# Patient Record
Sex: Male | Born: 1970 | Hispanic: No | Marital: Married | Smoking: Never smoker
Health system: Southern US, Community
[De-identification: ages and names within clinical notes are randomized; demographics above are authoritative.]

## PROBLEM LIST (undated history)

## (undated) DIAGNOSIS — E785 Hyperlipidemia, unspecified: Secondary | ICD-10-CM

## (undated) HISTORY — PX: BACK SURGERY: SHX140

## (undated) HISTORY — DX: Hyperlipidemia, unspecified: E78.5

---

## 2004-11-07 ENCOUNTER — Emergency Department (HOSPITAL_COMMUNITY): Admission: EM | Admit: 2004-11-07 | Discharge: 2004-11-07 | Payer: Self-pay | Admitting: Emergency Medicine

## 2007-02-16 IMAGING — CT CT MAXILLOFACIAL W/O CM
2 series · 16 of 40 positions shown, 20 images · non-contrast
Comparison: none

CLINICAL DATA: Injury to the left eye.  Struck with baseball.
TECHNIQUE: Axial scanning is performed using spiral technique.  Coronal reformations are done. 
 CT SCAN OF THE FACE:
 No evidence of facial fracture.  No fluid in the sinuses.  There is a soft tissue injury in the left periorbital tissues with some soft tissue swelling.  No definite injury to the globe is identified.

[Series 4627: — · axial · 0.35mm/px · z∈[-702,-572]mm · 13 of 151 slices shown, 17 images]
[im 11/151  brain]
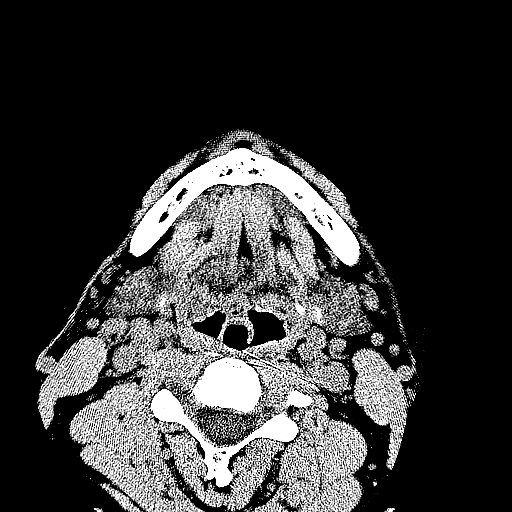
[im 11/151  bone]
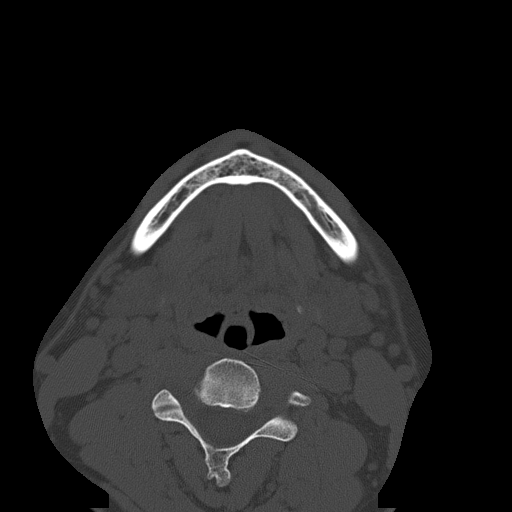
[im 21/151  bone]
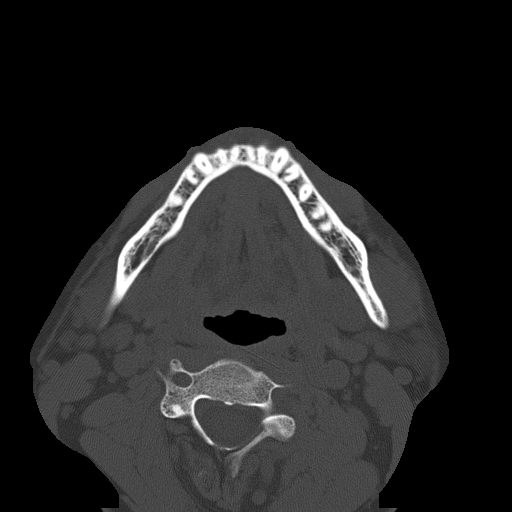
[im 32/151  bone]
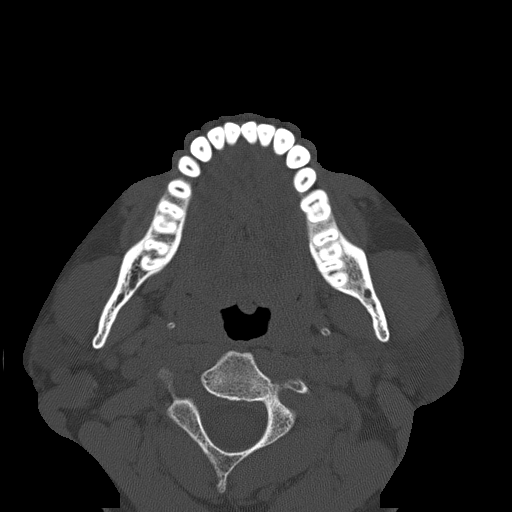
[im 42/151  bone]
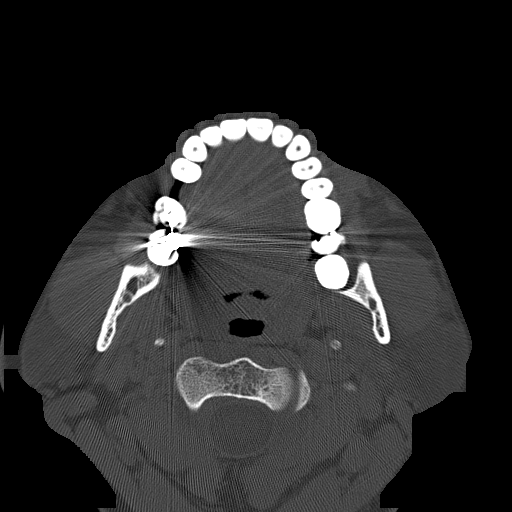
[im 52/151  brain]
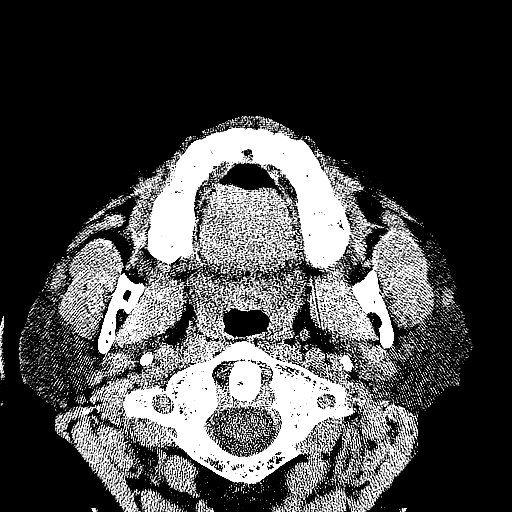
[im 52/151  bone]
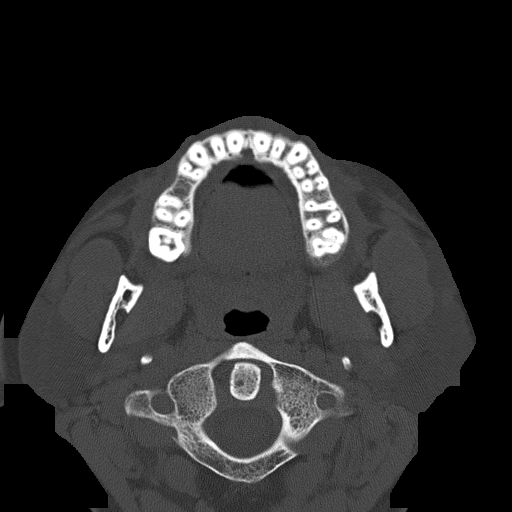
[im 63/151  bone]
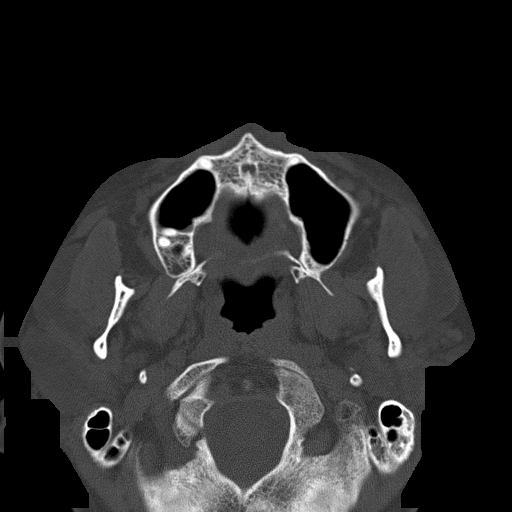
[im 78/151  bone]
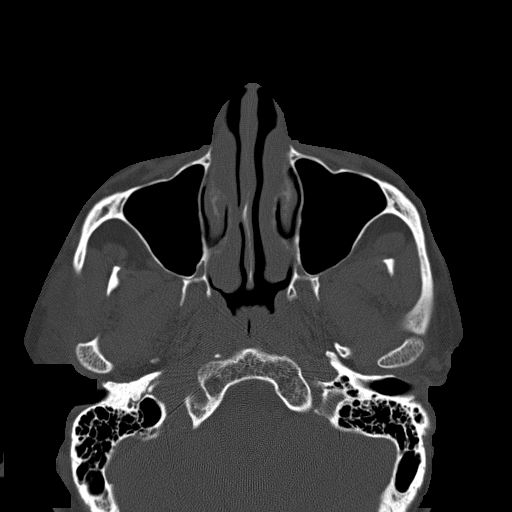
[im 88/151  bone]
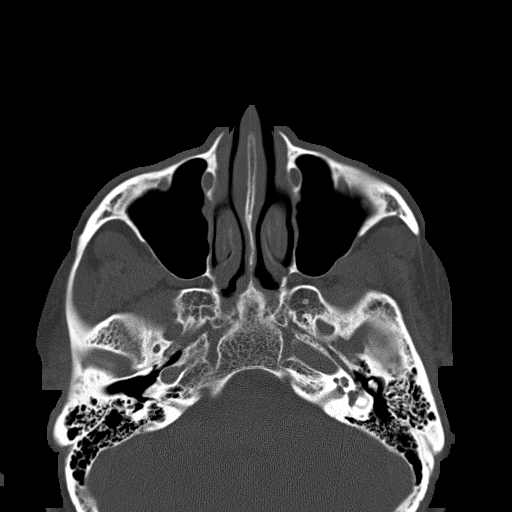
[im 99/151  brain]
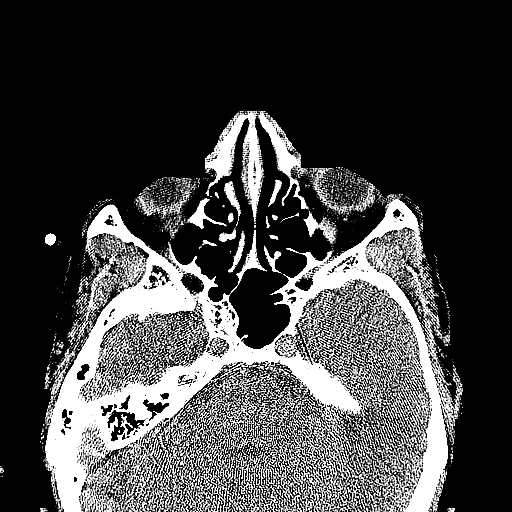
[im 99/151  bone]
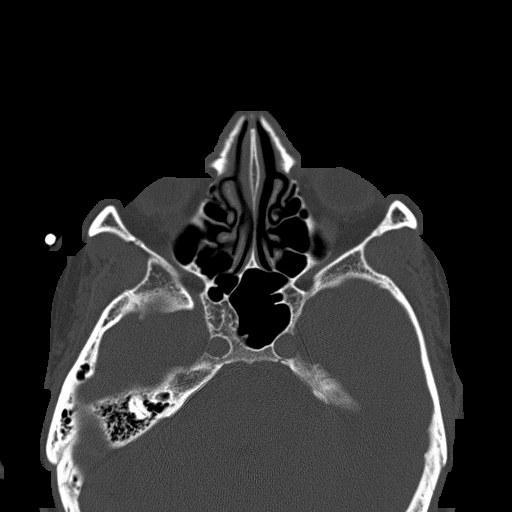
[im 109/151  bone]
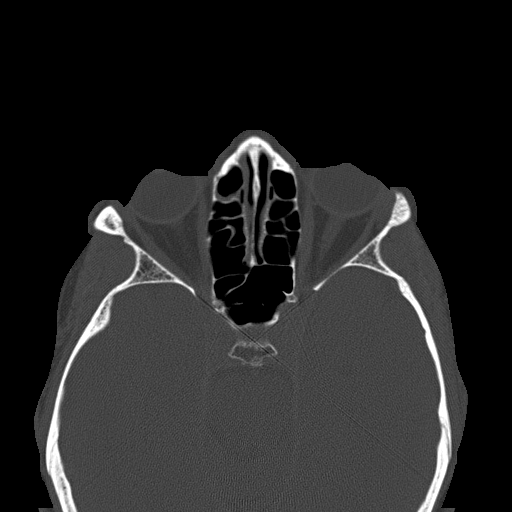
[im 119/151  bone]
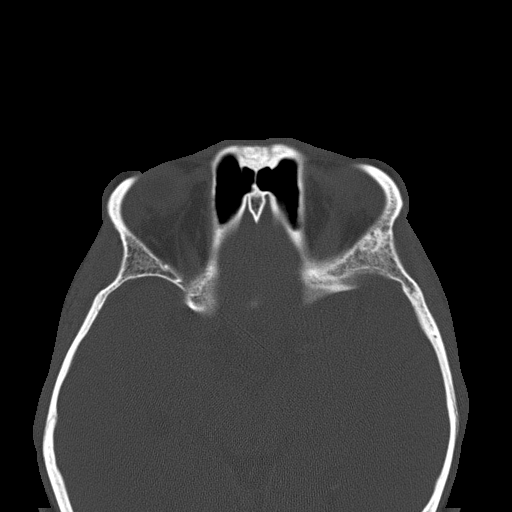
[im 130/151  bone]
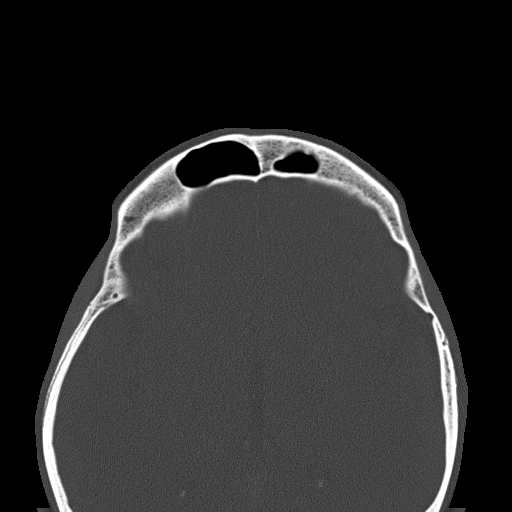
[im 140/151  brain]
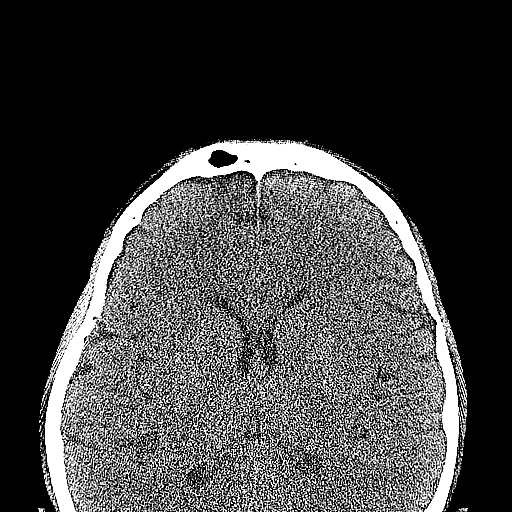
[im 140/151  bone]
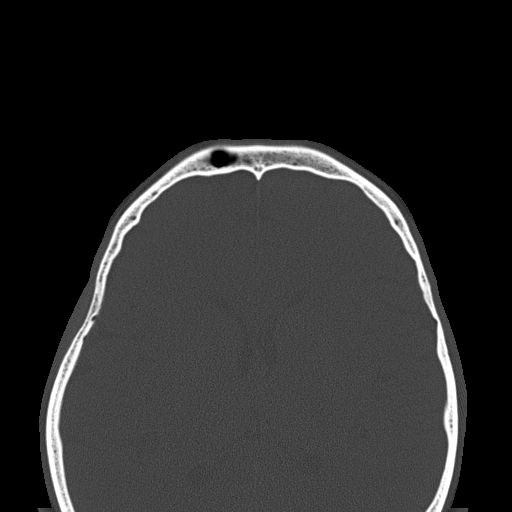

[coronals · coronal · 0.35mm/px · 3 of 50 slices shown]
[im 17/50  bone]
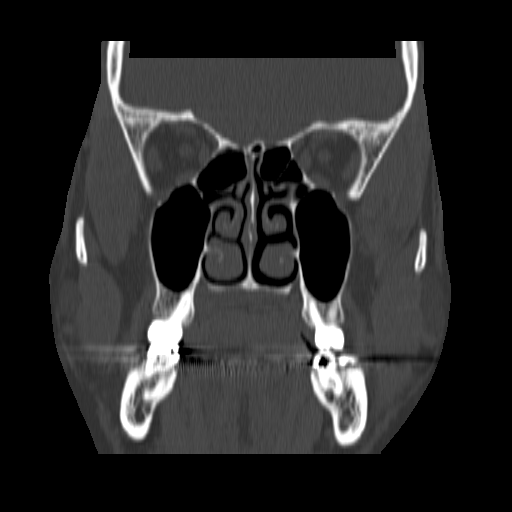
[im 22/50  bone]
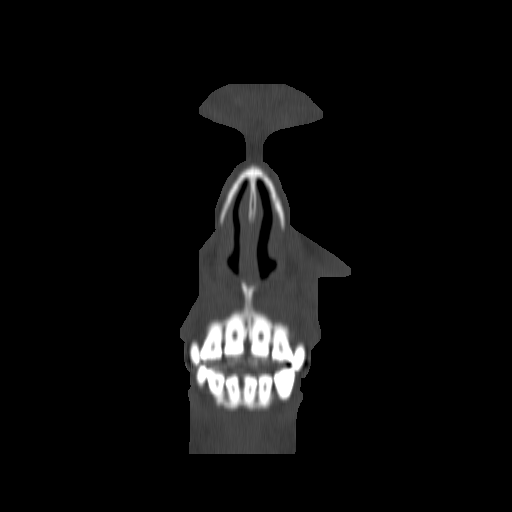
[im 28/50  bone]
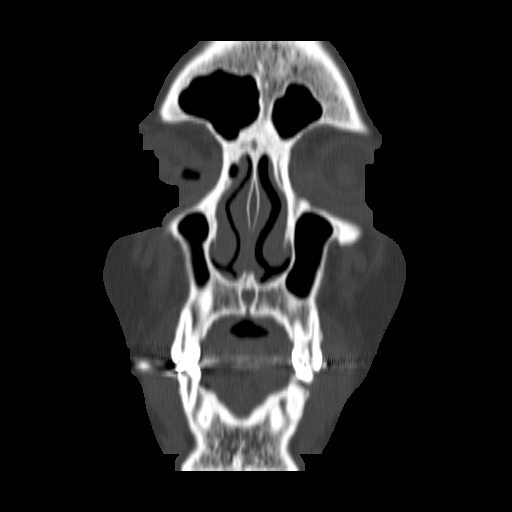

[16 of 40 positions shown; findings below may reference images not displayed]

IMPRESSION: Soft tissue injury in the region of the left orbit with superficial soft tissue swelling.   No evidence of fracture.

## 2008-04-23 ENCOUNTER — Ambulatory Visit (HOSPITAL_COMMUNITY): Admission: RE | Admit: 2008-04-23 | Discharge: 2008-04-24 | Payer: Self-pay | Admitting: Neurosurgery

## 2010-12-28 NOTE — Op Note (Signed)
NAMEANTHONYMICHAEL, MUNDAY NO.:  [id number]   MEDICAL RECORD NO.:  [id number]          PATIENT TYPE:  OIB   LOCATION:  3537                         FACILITY:  [facility]   PHYSICIAN:  Cristi Loron, M.D.DATE OF BIRTH:  [birth year]   DATE OF PROCEDURE:  04/23/2008  DATE OF DISCHARGE:                               OPERATIVE REPORT   BRIEF HISTORY:  The patient is a 40 year old Hispanic male who was  injured in a work-related accident.  He suffered back and leg pain.  He  was worked up with a lumbar MRI, which demonstrated a large herniated  disk at L4-5 with severe stenosis.  He failed medical management.  I  discussed the various treatment options with him including surgery.  The  patient weighed the risks, benefits, and alternatives of surgery, and  decided to proceed with a bilateral L4-5 diskectomy.   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   PROCEDURE:  Bilateral L4-5 diskectomy using microdissection.   SURGEON:  Cristi Loron, MD.   ASSISTANT:  Stefani Dama, MD.   ANESTHESIA:  General endotracheal.   ESTIMATED BLOOD LOSS:  75 mL.   SPECIMENS:  None.   DRAINS:  None.   COMPLICATIONS:  None.   DESCRIPTION OF PROCEDURE:  The patient was brought to the operative room  by the anesthesia team.  General endotracheal anesthesia was induced.  The patient was turned to the prone position on Wilson frame.  His  lumbosacral region was then prepared with Betadine, scrubbed with  Betadine solution.  Sterile drapes were applied.  I then injected the  area to be incised with Marcaine with epinephrine solution.  I used a  scalpel to make a linear midline incision over the L4-5 interspace.  I  used electrocautery to perform a bilateral subperiosteal dissection  exposing spinous process and lamina of the L4 and L5.  We obtained an  intraoperative radiograph to confirm our location and inserted  McCullough retractor for exposure.   We then brought the operative microscope into the field, and on  electromagnification and illumination, we completed the  microdissection/decompression.  I used a high-speed drill to perform  bilateral L4 laminotomies.  I widened the laminotomy with the Kerrison  punch and removed the L4-5 ligamentum flavum.  I performed  foraminotomies at the bilateral L5 nerve roots.  We then used  microdissection to free up the epidural tissue from the dura and the L5  nerve roots.  Dr. Danielle Dess gently retracted the thecal sac and nerve root  medially.  On the right side, this exposed underlying disk herniation.  We incised it with a 15-blade scalpel and removed the disk herniation  and performed a partial intervertebral diskectomy with the pituitary  forceps and Epstein curettes.  After we were satisfied with the  intervertebral diskectomy, we removed some redundant posterior  longitudinal ligaments from the vertebral endplates of the L4-5 further  decompressing the neural elements.  We then inspected into this and  L4-5  on the left side.  It was bulging mildly, but at this point, we had good  decompression of the thecal sac and the bilateral L5 nerve roots, and we  therefore did not enter into the intervertebral disk space on the left  side.  We then obtained hemostasis with bipolar electrocautery.  We  irrigated the wound with bacitracin solution.  We obtained stringent  hemostasis using bipolar electrocautery.  We removed the retractors and  then reapproximated the patient's thoracolumbar fascia with interrupted  #1 Vicryl suture, subcutaneous with interrupted 2-0 Vicryl suture, and  the skin with Steri-Strips and benzoin.  The wound was then closed with  bacitracin ointment.  A sterile dressing was applied.  The drapes were   removed.  The patient was subsequently returned to the supine position  where he was extubated by the anesthesia team and transported to the  postanesthesia care unit in stable condition.  All sponge, instrument,  and needle counts were correct at end of this case.      Cristi Loron, M.D.  Electronically Signed     JDJ/MEDQ  D:  04/23/2008  T:  04/24/2008  Job:  [id number]

## 2011-05-18 LAB — CBC
MCV: 94.4
RBC: 4.92
RDW: 13
WBC: 6.3

## 2013-12-03 ENCOUNTER — Encounter (HOSPITAL_COMMUNITY): Payer: Self-pay | Admitting: Emergency Medicine

## 2013-12-03 ENCOUNTER — Emergency Department (HOSPITAL_COMMUNITY): Payer: Managed Care, Other (non HMO)

## 2013-12-03 ENCOUNTER — Emergency Department (HOSPITAL_COMMUNITY)
Admission: EM | Admit: 2013-12-03 | Discharge: 2013-12-03 | Disposition: A | Discharge status: Home / Self Care | Payer: Managed Care, Other (non HMO) | Attending: Emergency Medicine | Admitting: Emergency Medicine

## 2013-12-03 DIAGNOSIS — R509 Fever, unspecified: Secondary | ICD-10-CM

## 2013-12-03 DIAGNOSIS — J069 Acute upper respiratory infection, unspecified: Secondary | ICD-10-CM | POA: Insufficient documentation

## 2013-12-03 DIAGNOSIS — B9789 Other viral agents as the cause of diseases classified elsewhere: Secondary | ICD-10-CM

## 2013-12-03 DIAGNOSIS — Z791 Long term (current) use of non-steroidal anti-inflammatories (NSAID): Secondary | ICD-10-CM | POA: Insufficient documentation

## 2013-12-03 DIAGNOSIS — J988 Other specified respiratory disorders: Secondary | ICD-10-CM

## 2013-12-03 LAB — COMPREHENSIVE METABOLIC PANEL
ALT: 46 U/L (ref 0–53)
AST: 35 U/L (ref 0–37)
Albumin: 3.7 g/dL (ref 3.5–5.2)
Alkaline Phosphatase: 98 U/L (ref 39–117)
BUN: 15 mg/dL (ref 6–23)
CO2: 27 mEq/L (ref 19–32)
Calcium: 8.8 mg/dL (ref 8.4–10.5)
Chloride: 101 mEq/L (ref 96–112)
Creatinine, Ser: 0.83 mg/dL (ref 0.50–1.35)
GFR calc Af Amer: >90 mL/min (ref >90)
GFR calc non Af Amer: >90 mL/min (ref >90)
GLUCOSE: 102 mg/dL — AB (ref 70–99)
Potassium: 3.9 mEq/L (ref 3.7–5.3)
Sodium: 136 mEq/L — ABNORMAL LOW (ref 137–147)
TOTAL PROTEIN: 7 g/dL (ref 6.0–8.3)
Total Bilirubin: 0.8 mg/dL (ref 0.3–1.2)

## 2013-12-03 LAB — I-STAT CG4 LACTIC ACID, ED: Lactic Acid, Venous: 1.19 mmol/L (ref 0.5–2.2)

## 2013-12-03 LAB — CBC WITH DIFFERENTIAL/PLATELET
BASOS ABS: 0 10*3/uL (ref 0.0–0.1)
BASOS PCT: 0 % (ref 0–1)
EOS ABS: 0.1 10*3/uL (ref 0.0–0.7)
EOS PCT: 1 % (ref 0–5)
HCT: 46 % (ref 39.0–52.0)
Hemoglobin: 15.7 g/dL (ref 13.0–17.0)
LYMPHS ABS: 0.5 10*3/uL — AB (ref 0.7–4.0)
LYMPHS PCT: 9 % — AB (ref 12–46)
MCH: 30.7 pg (ref 26.0–34.0)
MCHC: 34.1 g/dL (ref 30.0–36.0)
MCV: 89.8 fL (ref 78.0–100.0)
MONO ABS: 0.4 10*3/uL (ref 0.1–1.0)
Monocytes Relative: 8 % (ref 3–12)
Neutro Abs: 4.7 10*3/uL (ref 1.7–7.7)
Neutrophils Relative %: 82 % — ABNORMAL HIGH (ref 43–77)
PLATELETS: 149 10*3/uL — AB (ref 150–400)
RBC: 5.12 MIL/uL (ref 4.22–5.81)
RDW: 13.4 % (ref 11.5–15.5)
WBC: 5.7 10*3/uL (ref 4.0–10.5)

## 2013-12-03 LAB — URINALYSIS, ROUTINE W REFLEX MICROSCOPIC
Bilirubin Urine: NEGATIVE
Glucose, UA: NEGATIVE mg/dL
Hgb urine dipstick: NEGATIVE
KETONES UR: NEGATIVE mg/dL
Leukocytes, UA: NEGATIVE
Nitrite: NEGATIVE
Protein, ur: NEGATIVE mg/dL
SPECIFIC GRAVITY, URINE: 1.022 (ref 1.005–1.030)
UROBILINOGEN UA: 1 mg/dL (ref 0.0–1.0)
pH: 6.5 (ref 5.0–8.0)

## 2013-12-03 MED ORDER — IBUPROFEN 800 MG PO TABS
800.0000 mg | ORAL_TABLET | Freq: Once | ORAL | Status: AC
  Administered 2013-12-03: 800 mg via ORAL
  Filled 2013-12-03: qty 1

## 2013-12-03 MED ORDER — ACETAMINOPHEN 325 MG PO TABS
650.0000 mg | ORAL_TABLET | Freq: Once | ORAL | Status: AC
  Administered 2013-12-03: 650 mg via ORAL
  Filled 2013-12-03: qty 2

## 2013-12-03 MED ORDER — SODIUM CHLORIDE 0.9 % IV BOLUS (SEPSIS)
1000.0000 mL | Freq: Once | INTRAVENOUS | Status: AC
  Administered 2013-12-03: 1000 mL via INTRAVENOUS

## 2013-12-03 MED ORDER — HYDROCODONE-HOMATROPINE 5-1.5 MG/5ML PO SYRP: 5.0000 mL | ORAL_SOLUTION | Freq: Four times a day (QID) | ORAL | Status: DC | PRN

## 2013-12-03 MED ORDER — ALBUTEROL SULFATE HFA 108 (90 BASE) MCG/ACT IN AERS
2.0000 | INHALATION_SPRAY | Freq: Once | RESPIRATORY_TRACT | Status: AC
  Administered 2013-12-03: 2 via RESPIRATORY_TRACT
  Filled 2013-12-03: qty 6.7

## 2013-12-03 NOTE — ED Notes (Signed)
Per pt sts he is hurting all over, weakness, body aches, nausea. sts he has a cough that started today.

## 2013-12-03 NOTE — ED Notes (Signed)
Phlebotomy at bedside.

## 2013-12-03 NOTE — ED Notes (Signed)
Ambulated patient in hall. Patient maintained O2 saturation levels no less than 94% with the heart rate at a peak of 104 and low of 96 BPM.

## 2013-12-03 NOTE — ED Notes (Signed)
PA at bedside.

## 2013-12-03 NOTE — ED Notes (Signed)
Patient transported to X-ray 

## 2013-12-03 NOTE — Discharge Instructions (Signed)
Recommend that you alternate Tylenol and ibuprofen every 3 hours for fever control. Be sure to drink plenty of fluids. You may use albuterol inhaler, 2 puffs every 4 hours as needed for shortness of breath. You may use Vicodin as prescribed for cough. Access MyChart to followup on the results of your [facility] test [facility]. Followup with your primary care provider to ensure that symptoms resolve.  Viral Infections A viral infection can be caused by different types of viruses.Most viral infections are not serious and resolve on their own. However, some infections may cause severe symptoms and may lead to further complications. SYMPTOMS Viruses can frequently cause:  Minor sore throat.  Aches and pains.  Headaches.  Runny nose.  Different types of rashes.  Watery eyes.  Tiredness.  Cough.  Loss of appetite.  Gastrointestinal infections, resulting in nausea, vomiting, and diarrhea. These symptoms do not respond to antibiotics because the infection is not caused by bacteria. However, you might catch a bacterial infection following the viral infection. This is sometimes called a "superinfection." Symptoms of such a bacterial infection may include:  Worsening sore throat with pus and difficulty swallowing.  Swollen neck glands.  Chills and a high or persistent fever.  Severe headache.  Tenderness over the sinuses.  Persistent overall ill feeling (malaise), muscle aches, and tiredness (fatigue).  Persistent cough.  Yellow, green, or brown mucus production with coughing. HOME CARE INSTRUCTIONS   Only take over-the-counter or prescription medicines for pain, discomfort, diarrhea, or fever as directed by your caregiver.  Drink enough water and fluids to keep your urine clear or pale yellow. Sports drinks can provide valuable electrolytes, sugars, and hydration.  Get plenty of rest and maintain proper nutrition. Soups and broths with crackers or rice are  fine. SEEK IMMEDIATE MEDICAL CARE IF:   You have severe headaches, shortness of breath, chest pain, neck pain, or an unusual rash.  You have uncontrolled vomiting, diarrhea, or you are unable to keep down fluids.  You or your child has an oral temperature above 102 F (38.9 C), not controlled by medicine.  Your baby is older than 3 months with a rectal temperature of 102 F (38.9 C) or higher.  Your baby is 223 months old or younger with a rectal temperature of 100.4 F (38 C) or higher. MAKE SURE YOU:   Understand these instructions.  Will watch your condition.  Will get help right away if you are not doing well or get worse. Document Released: 05/11/2005 Document Revised: 10/24/2011 Document Reviewed: 12/06/2010 [facility] Patient Information 2014 [employer] [place].

## 2013-12-03 NOTE — ED Provider Notes (Signed)
CSN: [id number]     Arrival date & time 12/03/13  1831 History   First MD Initiated Contact with Patient 12/03/13 2012     Chief Complaint  Patient presents with  . Generalized Body Aches  . Nausea  . Cough    (Consider location/radiation/quality/duration/timing/severity/associated sxs/prior Treatment) HPI Comments: Patient is a 43 year old male with no significant past medical history who presents to the emergency department for generalized fatigue and malaise, myalgias, nausea, cough, and shortness of breath. Patient states that symptoms began today and has progressively worsened since onset. Patient has not tried anything for symptoms. Symptoms have been associated with a fever and patient febrile in the emergency department to 102F. Patient denies associated syncope, chest pain, vomiting, diarrhea, melena or hematochezia, dysuria or hematuria, rashes, numbness/tingling, and weakness.  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 She states that patient has had Lyme disease in the past and never showed signs of an outward rash. He denies any recent travel or contact with persons.  The history is provided by the patient. No language interpreter was used.    History reviewed. No pertinent past medical history. Past Surgical History  Procedure Laterality Date  . Back surgery     History reviewed. No pertinent family history. History  Substance Use Topics  . Smoking status: Never Smoker   . Smokeless tobacco: Not on file  . Alcohol Use: No    Review of Systems  Constitutional: Positive for fever, activity change and fatigue.  Respiratory: Positive for cough and shortness of breath.   Gastrointestinal: Positive for nausea. Negative for vomiting and diarrhea.  Genitourinary: Negative for dysuria and hematuria.  Skin: Negative for rash.  Neurological: Negative for syncope, weakness and numbness.  All other systems reviewed and are  negative.     Allergies  Review of patient's allergies indicates no known allergies.  Home Medications   Prior to Admission medications   Medication Sig Start Date End Date Taking? Authorizing Provider  diclofenac (VOLTAREN) 50 MG EC tablet Take 50 mg by mouth daily as needed for mild pain.   Yes Historical Provider, MD  ibuprofen (ADVIL,MOTRIN) 800 MG tablet Take 800 mg by mouth every 8 (eight) hours as needed for moderate pain.   Yes Historical Provider, MD   BP 97/58  Pulse 88  Temp(Src) 101 F (38.3 C) (Oral)  Resp 20  Wt 183 lb (83.008 kg)  SpO2 93%  Physical Exam  Nursing note and vitals reviewed. Constitutional: He is oriented to person, place, and time. He appears well-developed and well-nourished. No distress.  Patient appears ill and fatigued, but nontoxic/nonseptic appearing.  HENT:  Head: Normocephalic and atraumatic.  Mouth/Throat: Oropharynx is clear and moist. No oropharyngeal exudate.  Eyes: Conjunctivae and EOM are normal. Pupils are equal, round, and reactive to light. No scleral icterus.  Neck: Normal range of motion.  Cardiovascular: Normal rate, regular rhythm and normal heart sounds.   Pulmonary/Chest: Effort normal and breath sounds normal. No respiratory distress. He has no wheezes. He has no rales.  Chest expansion symmetrical. No tachypnea, retractions, or accessory muscle use.  Abdominal: Soft. He exhibits no distension. There is no tenderness. There is no rebound and no guarding.  No focal tenderness or peritoneal signs.  Musculoskeletal: Normal range of motion.  Neurological: He is alert and oriented to person, place, and time.  GCS 15. Speech is goal oriented. Patient moves extremities without ataxia.  Skin: Skin is warm  and dry. No rash noted. He is not diaphoretic. No erythema. No pallor.  Psychiatric: He has a normal mood and affect. His behavior is normal.    ED Course  Procedures (including critical care time) Labs Review Labs Reviewed   CBC WITH DIFFERENTIAL - Abnormal; Notable for the following:    Platelets 149 (*)    Neutrophils Relative % 82 (*)    Lymphocytes Relative 9 (*)    Lymphs Abs 0.5 (*)    All other components within normal limits  COMPREHENSIVE METABOLIC PANEL - Abnormal; Notable for the following:    Sodium 136 (*)    Glucose, Bld 102 (*)    All other components within normal limits  URINALYSIS, ROUTINE W REFLEX MICROSCOPIC  B. BURGDORFI ANTIBODIES  ROCKY MTN SPOTTED FVR AB, IGM-BLOOD  I-STAT CG4 LACTIC ACID, ED    Imaging Review Dg Chest 2 View  12/03/2013   CLINICAL DATA:  Shortness of breath, dizziness, and weakness. Some Cough. Never smoker.  EXAM: CHEST  2 VIEW  COMPARISON:  None.  FINDINGS: The heart size and mediastinal contours are within normal limits. Both lungs are clear. The visualized skeletal structures are unremarkable.  IMPRESSION: No active cardiopulmonary disease.   Electronically Signed   By: Burman NievesWilliam  Stevens M.D.   On: 12/03/2013 21:43     EKG Interpretation None      MDM   Final diagnoses:  Febrile illness  Viral respiratory illness    [id number] - 43 year old male presents for fever, malaise, weakness, myalgias, and cough. 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 Patient is ill appearing, but nontoxic/nonseptic appearing. Lungs clear to auscultation bilaterally and heart RRR. Labs today reveal no leukocytosis, anemia, or electrolyte imbalance. Liver and kidney function preserved. Urinalysis does not suggest infection. Lactate normal and chest x-ray without evidence of focal consolidation or pneumonia.  [facility] run given mildly low platelets and sodium in presence of fever. Have also run Lyme testing. Both tests are currently pending. Patient has responded well to antipyretics and IVF. He ambulates in the ED without hypoxia despite feeling mildly SOB. Will d/c with albuterol inhaler and Hycodan for symptomatic treatment. Will  f/u on results of pending labs. Patient agreeable to plan with no unaddressed concerns.  12/05/13 0515 Flow manager contacted as RMSF test came back high c/w acute infection. Have requested patient be prescribed doxycycline 100mg  BID x 10 days. Flow manager informed me that critical results and abx already called in for patient. Notes reviewed which confirmed that case reviewed by colleague Marlon Peliffany Greene, PA-C on 12/04/13.    Antony MaduraKelly Tristyn Demarest, PA-C 12/05/13 [phone]

## 2013-12-04 ENCOUNTER — Telehealth (HOSPITAL_BASED_OUTPATIENT_CLINIC_OR_DEPARTMENT_OTHER): Payer: Self-pay

## 2013-12-04 LAB — B. BURGDORFI ANTIBODIES: B BURGDORFERI AB IGG+ IGM: 0.79 {ISR}

## 2013-12-04 LAB — ROCKY MTN SPOTTED FVR AB, IGM-BLOOD: RMSF IGM: 2.95 IV — AB (ref 0.00–0.89)

## 2013-12-04 MED ORDER — DOXYCYCLINE HYCLATE 100 MG PO CAPS: 100.0000 mg | ORAL_CAPSULE | Freq: Two times a day (BID) | ORAL | Status: DC

## 2013-12-04 NOTE — ED Provider Notes (Signed)
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  He has tested positive [facility].   Doxycycline, 100 mg tab PO BID for 10 days.  Results for orders placed during the hospital encounter of 12/03/13 (from the past 24 hour(s))  CBC WITH DIFFERENTIAL     Status: Abnormal   Collection Time    12/03/13  7:00 PM      Result Value Ref Range   WBC 5.7  4.0 - 10.5 K/uL   RBC 5.12  4.22 - 5.81 MIL/uL   Hemoglobin 15.7  13.0 - 17.0 g/dL   HCT 16.146.0  09.639.0 - 04.552.0 %   MCV 89.8  78.0 - 100.0 fL   MCH 30.7  26.0 - 34.0 pg   MCHC 34.1  30.0 - 36.0 g/dL   RDW 40.913.4  81.111.5 - 91.415.5 %   Platelets 149 (*) 150 - 400 K/uL   Neutrophils Relative % 82 (*) 43 - 77 %   Neutro Abs 4.7  1.7 - 7.7 K/uL   Lymphocytes Relative 9 (*) 12 - 46 %   Lymphs Abs 0.5 (*) 0.7 - 4.0 K/uL   Monocytes Relative 8  3 - 12 %   Monocytes Absolute 0.4  0.1 - 1.0 K/uL   Eosinophils Relative 1  0 - 5 %   Eosinophils Absolute 0.1  0.0 - 0.7 K/uL   Basophils Relative 0  0 - 1 %   Basophils Absolute 0.0  0.0 - 0.1 K/uL  COMPREHENSIVE METABOLIC PANEL     Status: Abnormal   Collection Time    12/03/13  7:00 PM      Result Value Ref Range   Sodium 136 (*) 137 - 147 mEq/L   Potassium 3.9  3.7 - 5.3 mEq/L   Chloride 101  96 - 112 mEq/L   CO2 27  19 - 32 mEq/L   Glucose, Bld 102 (*) 70 - 99 mg/dL   BUN 15  6 - 23 mg/dL   Creatinine, Ser 7.820.83  0.50 - 1.35 mg/dL   Calcium 8.8  8.4 - 95.610.5 mg/dL   Total Protein 7.0  6.0 - 8.3 g/dL   Albumin 3.7  3.5 - 5.2 g/dL   AST 35  0 - 37 U/L   ALT 46  0 - 53 U/L   Alkaline Phosphatase 98  39 - 117 U/L   Total Bilirubin 0.8  0.3 - 1.2 mg/dL   GFR calc non Af Amer >90  >90 mL/min   GFR calc Af Amer >90  >90 mL/min  URINALYSIS, ROUTINE W REFLEX MICROSCOPIC     Status: None   Collection Time    12/03/13  7:24 PM      Result Value Ref Range   Color, Urine YELLOW  YELLOW   APPearance CLEAR  CLEAR   Specific Gravity, Urine 1.022  1.005 - 1.030   pH 6.5  5.0 - 8.0   Glucose, UA NEGATIVE  NEGATIVE mg/dL   Hgb urine dipstick NEGATIVE  NEGATIVE   Bilirubin Urine NEGATIVE  NEGATIVE   Ketones, ur NEGATIVE  NEGATIVE mg/dL   Protein, ur NEGATIVE  NEGATIVE mg/dL   Urobilinogen, UA 1.0  0.0 - 1.0 mg/dL   Nitrite NEGATIVE  NEGATIVE   Leukocytes, UA NEGATIVE  NEGATIVE  B. BURGDORFI ANTIBODIES     Status: None   Collection Time    12/03/13 10:37 PM  Result Value Ref Range   B burgdorferi Ab IgG+IgM 0.79    ROCKY MTN SPOTTED FVR AB, IGM-BLOOD     Status: Abnormal   Collection Time    12/03/13 10:37 PM      Result Value Ref Range   RMSF IgM 2.95 (*) 0.00 - 0.89 IV  I-STAT CG4 LACTIC ACID, ED     Status: None   Collection Time    12/03/13 10:46 PM      Result Value Ref Range   Lactic Acid, Venous 1.19  0.5 - 2.2 mmol/L     Dorthula Matasiffany G Ellakate Gonsalves, PA-C 12/04/13 1840  Shaquanda Graves Irine SealG Kadi Hession, PA-C 12/04/13 1840  Dorthula Matasiffany G Shanessa Hodak, PA-C 12/04/13 1840

## 2013-12-04 NOTE — ED Provider Notes (Signed)
Medical screening examination/treatment/procedure(s) were performed by non-physician practitioner and as supervising physician I was immediately available for consultation/collaboration.   EKG Interpretation None        Charles B. Sheldon, MD 12/04/13 2242 

## 2013-12-04 NOTE — Telephone Encounter (Signed)
[facility] w[facility]critical RMSF IgM of 2.95.  Chart reviewed by T. Neva SeatGreene PA "Doxycyline 100 mg capsule, Take 1 capsule (100 mg total) by mouth 2 x daily # 20, No RF"  Called and spoke w/ pts wife.  Pt still very sick.  Informed of Dx and need for addl medication.  [facility] [phone].

## 2013-12-06 NOTE — ED Provider Notes (Signed)
Medical screening examination/treatment/procedure(s) were performed by non-physician practitioner and as supervising physician I was immediately available for consultation/collaboration.    Celene KrasJon R Shirelle Tootle, MD 12/06/13 [phone]

## 2014-02-11 ENCOUNTER — Ambulatory Visit (INDEPENDENT_AMBULATORY_CARE_PROVIDER_SITE_OTHER): Payer: Managed Care, Other (non HMO) | Admitting: Internal Medicine

## 2014-02-11 ENCOUNTER — Encounter: Payer: Self-pay | Admitting: Internal Medicine

## 2014-02-11 VITALS — BP 128/82 | HR 43 | Temp 97.8°F | Ht 66.0 in | Wt 191.0 lb

## 2014-02-11 DIAGNOSIS — R609 Edema, unspecified: Secondary | ICD-10-CM

## 2014-02-11 DIAGNOSIS — R600 Localized edema: Secondary | ICD-10-CM

## 2014-02-11 DIAGNOSIS — R52 Pain, unspecified: Secondary | ICD-10-CM

## 2014-02-11 DIAGNOSIS — E785 Hyperlipidemia, unspecified: Secondary | ICD-10-CM | POA: Insufficient documentation

## 2014-02-11 LAB — CBC WITH DIFFERENTIAL/PLATELET
BASOS ABS: 0 10*3/uL (ref 0.0–0.1)
Basophils Relative: 0 % (ref 0–1)
EOS PCT: 3 % (ref 0–5)
Eosinophils Absolute: 0.2 10*3/uL (ref 0.0–0.7)
HEMATOCRIT: 46.9 % (ref 39.0–52.0)
Hemoglobin: 16 g/dL (ref 13.0–17.0)
LYMPHS ABS: 2.1 10*3/uL (ref 0.7–4.0)
Lymphocytes Relative: 37 % (ref 12–46)
MCH: 30.1 pg (ref 26.0–34.0)
MCHC: 34.1 g/dL (ref 30.0–36.0)
MCV: 88.2 fL (ref 78.0–100.0)
MONO ABS: 0.6 10*3/uL (ref 0.1–1.0)
MONOS PCT: 10 % (ref 3–12)
NEUTROS ABS: 2.9 10*3/uL (ref 1.7–7.7)
NEUTROS PCT: 50 % (ref 43–77)
Platelets: 175 10*3/uL (ref 150–400)
RBC: 5.32 MIL/uL (ref 4.22–5.81)
RDW: 13.9 % (ref 11.5–15.5)
WBC: 5.7 10*3/uL (ref 4.0–10.5)

## 2014-02-11 LAB — COMPREHENSIVE METABOLIC PANEL
ALBUMIN: 4.2 g/dL (ref 3.5–5.2)
ALT: 34 U/L (ref 0–53)
AST: 26 U/L (ref 0–37)
Alkaline Phosphatase: 92 U/L (ref 39–117)
BUN: 13 mg/dL (ref 6–23)
CO2: 30 mEq/L (ref 19–32)
CREATININE: 0.9 mg/dL (ref 0.50–1.35)
Calcium: 9.1 mg/dL (ref 8.4–10.5)
Chloride: 102 mEq/L (ref 96–112)
Glucose, Bld: 96 mg/dL (ref 70–99)
Potassium: 4.1 mEq/L (ref 3.5–5.3)
SODIUM: 137 meq/L (ref 135–145)
Total Bilirubin: 0.6 mg/dL (ref 0.2–1.2)
Total Protein: 6.7 g/dL (ref 6.0–8.3)

## 2014-02-11 LAB — C-REACTIVE PROTEIN: CRP: <0.5 mg/dL (ref <0.60)

## 2014-02-11 LAB — RHEUMATOID FACTOR

## 2014-02-11 LAB — SEDIMENTATION RATE: SED RATE: 1 mm/h (ref 0–16)

## 2014-02-11 NOTE — Progress Notes (Addendum)
Patient ID: Phillip Dickerson, male   DOB: March 24, 1971, 43 y.o.   MRN: [id number]         [facility]  Patient Active Problem List   Diagnosis Date Noted  . Dyslipidemia 02/11/2014  . Diffuse pain 02/11/2014  . Pedal edema 02/11/2014    Patient's Medications  New Prescriptions   No medications on file  Previous Medications   IBUPROFEN (ADVIL,MOTRIN) 800 MG TABLET    Take 800 mg by mouth every 8 (eight) hours as needed for moderate pain.  Modified Medications   No medications on file  Discontinued Medications   DICLOFENAC (VOLTAREN) 50 MG EC TABLET    Take 50 mg by mouth daily as needed for mild pain.   DOXYCYCLINE (VIBRAMYCIN) 100 MG CAPSULE    Take 1 capsule (100 mg total) by mouth 2 (two) times daily.   HYDROCODONE-HOMATROPINE (HYCODAN) 5-1.5 MG/5ML SYRUP    Take 5 mLs by mouth every 6 (six) hours as needed for cough.    Subjective: Mr. Lucius ConnMedina is referred to me for evaluation of chronic, diffuse pain and possible Lyme disease. He started having diffuse pain in his hands arms back and legs for about one year ago. He's also had intermittent pedal edema about once a month. 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 He has frequent tick exposure. He had Lyme serology done in July of 2014. I have the results of the enzyme immunoassay screening tests and the total IgM and IgG antibody and separate IgM antibody were both positive. I do not see the Western blot confirmation was done at that time. He does not recall being treated at that time.  He developed an acute febrile illness in April and was seen here in our emergency department. Document spotted fever serology was obtained and was positive. Repeat Lyme serology was obtained. The IgM EIA screening test was again positive but the IgG Western blot and IgM Western blot confirmatory test were negative.  The day after being in the emergency department  he was started on doxycycline and took that for 2 months. His fevers resolved within a few days. His wife recalls noting a red rash on his back about 3 days after starting doxycycline. The rash resolved fairly promptly. He noted no improvement in his chronic pain or the intermittent pedal edema. He had a great deal of gastric upset while on doxycycline.  Review of Systems: Constitutional: positive for transient fever in April, negative for anorexia, chills, fevers, sweats and weight loss Eyes: negative Ears, nose, mouth, throat, and face: negative Respiratory: negative Cardiovascular: negative Gastrointestinal: negative Genitourinary:negative  Past Medical History  Diagnosis Date  . Hyperlipidemia     History  Substance Use Topics  . Smoking status: Never Smoker   . Smokeless tobacco: Never Used  . Alcohol Use: No    Family History  Problem Relation Age of Onset  . Diabetes Mother   . Heart disease Father   . Diabetes Father   . Hypertension Father   . Hyperlipidemia Father     No Known Allergies  Objective: Temp: 97.8 F (36.6 C) (06/30 1019) Temp src: Oral (06/30 1019) BP: 128/82 mmHg (06/30 1019) Pulse Rate: 43 (06/30 1019)  General: He is in no distress Skin: No rash Lymph nodes: No palpable adenopathy Oral: No oropharyngeal lesions Lungs: Clear Cor: Regular S1 and S2 with no murmurs Abdomen: Soft and nontender with  no palpable masses Joints and extremities: No abnormalities noted  Lab Results Sedimentation rate 02/03/2014: 9   Assessment: I cannot rule out the possibility that [facility] that was treated with doxycycline. I do not believe he has had Lyme disease. The screening EIA for Lyme disease he does frequently falsely positive. A negative western blot tests rule out Lyme disease. I do not think that he is likely to have an infectious cause for his chronic pain or intermittent pedal edema.  Plan: 1. Check CBC,  complete metabolic panel and rheumatoid factor   Cliffton AstersJohn Shineka Auble, MD [facility] [phone] pager   [phone] cell 02/11/2014, 12:20 PM  Addendum:  Lab Results  Component Value Date   WBC 5.7 02/11/2014   HGB 16.0 02/11/2014   HCT 46.9 02/11/2014   MCV 88.2 02/11/2014   PLT 175 02/11/2014   CMP     Component Value Date/Time   NA 137 02/11/2014 1108   K 4.1 02/11/2014 1108   CL 102 02/11/2014 1108   CO2 30 02/11/2014 1108   GLUCOSE 96 02/11/2014 1108   BUN 13 02/11/2014 1108   CREATININE 0.90 02/11/2014 1108   CREATININE 0.83 12/03/2013 1900   CALCIUM 9.1 02/11/2014 1108   PROT 6.7 02/11/2014 1108   ALBUMIN 4.2 02/11/2014 1108   AST 26 02/11/2014 1108   ALT 34 02/11/2014 1108   ALKPHOS 92 02/11/2014 1108   BILITOT 0.6 02/11/2014 1108   GFRNONAA >90 12/03/2013 1900   GFRAA >90 12/03/2013 1900   Sed Rate (mm/hr)  Date Value  02/11/2014 1      CRP (mg/dL)  Date Value  1/91/47826/30/2015 <0.5    Rheumatoid factor 02/11/2014: Negative  I do not believe that he has Lyme disease or any other active infection at this time. I suspect that his chronic pain is noninfectious.  Cliffton AstersJohn Khadijah Mastrianni, MD [facility] [phone] pager   [phone] cell 02/12/2014, 12:38 PM

## 2014-02-13 ENCOUNTER — Ambulatory Visit (INDEPENDENT_AMBULATORY_CARE_PROVIDER_SITE_OTHER): Payer: Managed Care, Other (non HMO) | Admitting: Internal Medicine

## 2014-02-13 ENCOUNTER — Encounter: Payer: Self-pay | Admitting: Internal Medicine

## 2014-02-13 VITALS — BP 130/98 | HR 49 | Ht 66.0 in | Wt 190.7 lb

## 2014-02-13 DIAGNOSIS — G4730 Sleep apnea, unspecified: Secondary | ICD-10-CM

## 2014-02-13 DIAGNOSIS — R001 Bradycardia, unspecified: Secondary | ICD-10-CM

## 2014-02-13 DIAGNOSIS — R0989 Other specified symptoms and signs involving the circulatory and respiratory systems: Secondary | ICD-10-CM

## 2014-02-13 DIAGNOSIS — E785 Hyperlipidemia, unspecified: Secondary | ICD-10-CM

## 2014-02-13 DIAGNOSIS — R5381 Other malaise: Secondary | ICD-10-CM

## 2014-02-13 DIAGNOSIS — G4710 Hypersomnia, unspecified: Secondary | ICD-10-CM

## 2014-02-13 DIAGNOSIS — R5383 Other fatigue: Secondary | ICD-10-CM

## 2014-02-13 DIAGNOSIS — R0683 Snoring: Secondary | ICD-10-CM

## 2014-02-13 DIAGNOSIS — I498 Other specified cardiac arrhythmias: Secondary | ICD-10-CM

## 2014-02-13 DIAGNOSIS — G4719 Other hypersomnia: Secondary | ICD-10-CM

## 2014-02-13 DIAGNOSIS — R0609 Other forms of dyspnea: Secondary | ICD-10-CM

## 2014-02-13 NOTE — Progress Notes (Signed)
OFFICE NOTE  Chief Complaint:  Bradycardia, fatigue  Primary Care Physician: Monico Blitz, MD  HPI:  Phillip Dickerson is a 43 year old male who has complaints of fatigue and was noted to have progressive bradycardia. Per his wife who is a Marine scientist in Dr. Trena Platt office, Phillip Dickerson has had long-standing bradycardia with heart rates in the 50s however recently it has been slower in the 40s and occasionally in the 30s by pulse oximetry at home. 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 He reports that he is generally able to do these activities without any difficulty. He does say that he has trouble sleeping at night he gets very little sleep. When he sleeps he is noted to snore and his wife says that he does stop breathing on occasion. He does not feel that the sleep that he gets his restorative.  Recently he had a tick bite and underwent a thorough workup and saw Dr. Megan Salon with infectious diseases. It was originally thought he had Lyme disease, however it appears that he had [facility] Spotted Fever. He was appropriately treated with doxycycline. She is bradycardia may be slightly worse after this episode, but it was clearly present prior to this. It is well-known that both diseases could cause conduction abnormalities in the heart. Phillip Dickerson denies any chest pain or shortness of breath with exertion.  It should also be noted that based on some of the symptoms he underwent an EKG in 2012 which looks similar to the EKG today with a heart rate in the 40s. He also underwent an echocardiogram which demonstrated normal systolic function, moderate right atrial enlargement and mild right ventricular enlargement. There was a question of pulmonary hypertension however right ventricular systolic pressure was not reported. Finally recent lipid testing indicated a total cholesterol of 260, triglycerides 211, HDL 35 and LDL 183.  PMHx:  Past Medical  History  Diagnosis Date  . Hyperlipidemia     Past Surgical History  Procedure Laterality Date  . Back surgery      FAMHx:  Family History  Problem Relation Age of Onset  . Diabetes Mother   . Heart disease Father   . Diabetes Father   . Hypertension Father   . Hyperlipidemia Father     SOCHx:   reports that he has never smoked. He has never used smokeless tobacco. He reports that he does not drink alcohol or use illicit drugs.  ALLERGIES:  No Known Allergies  ROS: A comprehensive review of systems was negative except for: Constitutional: positive for fatigue Cardiovascular: positive for bradycardia  HOME MEDS: Current Outpatient Prescriptions  Medication Sig Dispense Refill  . ibuprofen (ADVIL,MOTRIN) 800 MG tablet Take 800 mg by mouth every 8 (eight) hours as needed for moderate pain.       No current facility-administered medications for this visit.    LABS/IMAGING: Results for orders placed in visit on 02/11/14 (from the past 48 hour(s))  COMPREHENSIVE METABOLIC PANEL     Status: None   Collection Time    02/11/14 11:08 AM      Result Value Ref Range   Sodium 137  135 - 145 mEq/L   Potassium 4.1  3.5 - 5.3 mEq/L   Chloride 102  96 - 112 mEq/L   CO2 30  19 - 32 mEq/L   Glucose, Bld 96  70 - 99 mg/dL   BUN 13  6 - 23 mg/dL  Creat 0.90  0.50 - 1.35 mg/dL   Total Bilirubin 0.6  0.2 - 1.2 mg/dL   Alkaline Phosphatase 92  39 - 117 U/L   AST 26  0 - 37 U/L   ALT 34  0 - 53 U/L   Total Protein 6.7  6.0 - 8.3 g/dL   Albumin 4.2  3.5 - 5.2 g/dL   Calcium 9.1  8.4 - 10.5 mg/dL  C-REACTIVE PROTEIN     Status: None   Collection Time    02/11/14 11:08 AM      Result Value Ref Range   CRP <0.5  <0.60 mg/dL  SEDIMENTATION RATE     Status: None   Collection Time    02/11/14 11:08 AM      Result Value Ref Range   Sed Rate 1  0 - 16 mm/hr  RHEUMATOID FACTOR     Status: None   Collection Time    02/11/14 11:08 AM      Result Value Ref Range   Rheumatoid Factor  <10  <=14 IU/mL   Comment:                                Interpretive Table                         Low Positive: 15 - 41 IU/mL                         High Positive:  >= 42 IU/mL            In addition to the RF result, and clinical symptoms including joint      involvement, the 2010 ACR Classification Criteria for      scoring/diagnosing Rheumatoid Arthritis include the results of the      following tests:  CRP (35573), ESR (15010), and CCP (APCA) (22025).      [email or web address]/clinical/classification/ra/ra_2010.asp  CBC WITH DIFFERENTIAL     Status: None   Collection Time    02/11/14 11:08 AM      Result Value Ref Range   WBC 5.7  4.0 - 10.5 K/uL   RBC 5.32  4.22 - 5.81 MIL/uL   Hemoglobin 16.0  13.0 - 17.0 g/dL   HCT 46.9  39.0 - 52.0 %   MCV 88.2  78.0 - 100.0 fL   MCH 30.1  26.0 - 34.0 pg   MCHC 34.1  30.0 - 36.0 g/dL   RDW 13.9  11.5 - 15.5 %   Platelets 175  150 - 400 K/uL   Neutrophils Relative % 50  43 - 77 %   Neutro Abs 2.9  1.7 - 7.7 K/uL   Lymphocytes Relative 37  12 - 46 %   Lymphs Abs 2.1  0.7 - 4.0 K/uL   Monocytes Relative 10  3 - 12 %   Monocytes Absolute 0.6  0.1 - 1.0 K/uL   Eosinophils Relative 3  0 - 5 %   Eosinophils Absolute 0.2  0.0 - 0.7 K/uL   Basophils Relative 0  0 - 1 %   Basophils Absolute 0.0  0.0 - 0.1 K/uL   Smear Review Criteria for review not met     No results found.  VITALS: BP 130/98  Pulse 49  Ht 5' 6"  (1.676 m)  Wt 190 lb 11.2 oz (86.501 kg)  BMI 30.79 kg/m2  EXAM: General appearance: alert and no distress Neck: no carotid bruit, no JVD and thyroid not enlarged, symmetric, no tenderness/mass/nodules Lungs: clear to auscultation bilaterally Heart: Regular bradycardia, no murmur rub or gallop Abdomen: soft, non-tender; bowel sounds normal; no masses,  no organomegaly Extremities: extremities normal, atraumatic, no cyanosis or edema Pulses: 2+ and symmetric Skin: Skin color, texture, turgor normal. No rashes or  lesions Neurologic: Grossly normal Psych: Mood, affect normal  EKG: Sinus bradycardia at 49  ASSESSMENT: 1. Fatigue 2. Bradycardia 3. Nonrestorative sleep/snoring/witnessed apnea 4. Dyslipidemia  PLAN: 1.   Phillip Dickerson is here for evaluation of bradycardia. This is not a new issue is his EKG in 2012 showed similar bradycardia. He had an echocardiogram which possibly showed some right sided chamber enlargement. There is some concern for possible sleep apnea however he is quite active and works a number of jobs and has not probably get enough sleep. Nonetheless, his wife reports that he does have some witnessed apnea events and he did not feel that his sleep is restorative. I would recommend a sleep study. This may also explain some of his fatigue and could be related to his bradycardia. I suspect his bradycardia is just due to high vagal tone. I would recommend an exercise treadmill stress test to evaluate both for possible ischemia, although this is unlikely, and to evaluate his chronotropic competence.  Finally, it is noted that his cholesterol is fairly high and has not changed with dietary modification. I think we should consider cholesterol medication and will discuss this with him and followup. If his stress testing is negative, based on his significant family history of diabetes and heart disease, I would recommend driving the LDL cholesterol to at least less than 130.  Thank you for the consultation.  Pixie Casino, MD, [facility] Attending Cardiologist [facility]  Dickerson,Phillip C 02/13/2014, 9:07 AM

## 2014-02-13 NOTE — Patient Instructions (Signed)
Your physician has requested that you have an exercise tolerance test. For further information please visit [email or web address]. Please also follow instruction sheet, as given.  Your physician has recommended that you have a sleep study. This test records several body functions during sleep, including: brain activity, eye movement, oxygen and carbon dioxide blood levels, heart rate and rhythm, breathing rate and rhythm, the flow of air through your mouth and nose, snoring, body muscle movements, and chest and belly movement. ** [facility] at [facility]  Please schedule a follow up appointment with Dr. Rennis GoldenHilty in about 1 month - after your tests.

## 2014-02-18 ENCOUNTER — Telehealth (HOSPITAL_COMMUNITY): Payer: Self-pay

## 2014-02-18 NOTE — Telephone Encounter (Signed)
Encounter complete. 

## 2014-02-20 ENCOUNTER — Ambulatory Visit (HOSPITAL_COMMUNITY)
Admission: RE | Admit: 2014-02-20 | Discharge: 2014-02-20 | Disposition: A | Discharge status: Home / Self Care | Payer: Managed Care, Other (non HMO) | Source: Ambulatory Visit | Attending: Internal Medicine | Admitting: Internal Medicine

## 2014-02-20 DIAGNOSIS — R001 Bradycardia, unspecified: Secondary | ICD-10-CM

## 2014-02-20 DIAGNOSIS — I498 Other specified cardiac arrhythmias: Secondary | ICD-10-CM | POA: Insufficient documentation

## 2014-02-20 NOTE — Procedures (Signed)
Exercise Treadmill Test    Test  Exercise Tolerance Test Ordering MD: Zoila ShutterKenneth Hilty, MD  Interpreting MD:   Unique Test No: 1  Treadmill:  1  Indication for ETT: Chronotropic Competence  Contraindication to ETT: Yes   Stress Modality: exercise - treadmill  Cardiac Imaging Performed: non   Protocol: standard Bruce - maximal  Max BP:  149/91  Max MPHR (bpm):  177 85% MPR (bpm): 150  MPHR obtained (bpm): 160 % MPHR obtained: 90  Reached 85% MPHR (min:sec): 9:45 Total Exercise Time (min-sec):  10:54  Workload in METS: 13.20 Borg Scale:   Reason ETT Terminated:  fatigue    ST Segment Analysis At Rest: Sinus bradycardia; no ST changes.  With Exercise: no evidence of significant ST depression  Other Information Arrhythmia:  No Angina during ETT:  absent (0) Quality of ETT:  diagnostic  ETT Interpretation:  normal - no evidence of ischemia by ST analysis  Comments: ETT with good exercise tolerance; no chest pain; no ST changes; normal BP response; negative adequate ETT. Olga MillersBrian Maizee Reinhold

## 2014-03-18 ENCOUNTER — Ambulatory Visit: Payer: Managed Care, Other (non HMO) | Admitting: Internal Medicine

## 2014-04-03 ENCOUNTER — Other Ambulatory Visit: Payer: Self-pay | Admitting: *Deleted

## 2014-04-03 DIAGNOSIS — G4730 Sleep apnea, unspecified: Secondary | ICD-10-CM

## 2014-04-03 DIAGNOSIS — R0683 Snoring: Secondary | ICD-10-CM

## 2014-04-03 DIAGNOSIS — G4719 Other hypersomnia: Secondary | ICD-10-CM

## 2014-04-03 DIAGNOSIS — G4710 Hypersomnia, unspecified: Secondary | ICD-10-CM

## 2014-04-03 DIAGNOSIS — R0609 Other forms of dyspnea: Secondary | ICD-10-CM

## 2014-04-03 DIAGNOSIS — R0989 Other specified symptoms and signs involving the circulatory and respiratory systems: Secondary | ICD-10-CM

## 2014-04-09 ENCOUNTER — Other Ambulatory Visit: Payer: Self-pay | Admitting: *Deleted

## 2014-04-09 DIAGNOSIS — R0683 Snoring: Secondary | ICD-10-CM

## 2014-04-09 DIAGNOSIS — G4719 Other hypersomnia: Secondary | ICD-10-CM

## 2014-04-09 DIAGNOSIS — G4730 Sleep apnea, unspecified: Secondary | ICD-10-CM

## 2014-04-09 DIAGNOSIS — R012 Other cardiac sounds: Secondary | ICD-10-CM

## 2014-04-09 DIAGNOSIS — R0989 Other specified symptoms and signs involving the circulatory and respiratory systems: Secondary | ICD-10-CM

## 2014-04-09 DIAGNOSIS — G4710 Hypersomnia, unspecified: Secondary | ICD-10-CM

## 2014-04-09 DIAGNOSIS — R0609 Other forms of dyspnea: Secondary | ICD-10-CM

## 2014-04-11 ENCOUNTER — Ambulatory Visit (INDEPENDENT_AMBULATORY_CARE_PROVIDER_SITE_OTHER): Payer: Managed Care, Other (non HMO) | Admitting: Cardiology

## 2014-04-11 ENCOUNTER — Encounter: Payer: Self-pay | Admitting: Cardiology

## 2014-04-11 VITALS — BP 107/70 | HR 65 | Ht 66.0 in | Wt 184.0 lb

## 2014-04-11 DIAGNOSIS — R001 Bradycardia, unspecified: Secondary | ICD-10-CM

## 2014-04-11 DIAGNOSIS — R5383 Other fatigue: Secondary | ICD-10-CM

## 2014-04-11 DIAGNOSIS — R5381 Other malaise: Secondary | ICD-10-CM

## 2014-04-11 DIAGNOSIS — I498 Other specified cardiac arrhythmias: Secondary | ICD-10-CM

## 2014-04-11 NOTE — Progress Notes (Signed)
Clinical Summary Mr. Kamau is a 43 y.o.male seen today for follow up of the following medical problems.   1. Bradycardia - reports history of intermittent fatigue and dizziness - from patient reported history he has had low heart rates in 50s for long time - recently started workup for possible symptomatic bradycardia - recent monitor ordered by pcp, min HR 35 (630AM), max HR 150, avg 71.No significant ectopy. No diary submitted. The low rates in 30s and 40s were sinus brady, some occuring in early morning hours and some in early evening. 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  - ETT 02/2014, patient reached peak HR 177 bpm, representing 90% of THR. Exercised 10 minutes, no ST changes, low risk Duke treadmill score of 10.  - TSH 1.45. Some question about history of Lyme disease, recently seen by ID and thought not to be the case. He has had multiple serologies tested.   2. Sleep apnea - recent positive sleep study, has just started CPAP  Past Medical History  Diagnosis Date  . Hyperlipidemia      No Known Allergies   Current Outpatient Prescriptions  Medication Sig Dispense Refill  . ibuprofen (ADVIL,MOTRIN) 800 MG tablet Take 800 mg by mouth every 8 (eight) hours as needed for moderate pain.       No current facility-administered medications for this visit.     Past Surgical History  Procedure Laterality Date  . Back surgery       No Known Allergies    Family History  Problem Relation Age of Onset  . Diabetes Mother   . Heart disease Father   . Diabetes Father   . Hypertension Father   . Hyperlipidemia Father      Social History Mr. Barnhart reports that he has never smoked. He has never used smokeless tobacco. Mr. Marti reports that he does not drink alcohol.   Review of Systems CONSTITUTIONAL: occasional fatigue  HEENT: Eyes: No visual loss, blurred vision, double vision or yellow sclerae.No hearing loss, sneezing,  congestion, runny nose or sore throat.  SKIN: No rash or itching.  CARDIOVASCULAR: no chest pain, orthopnea RESPIRATORY: No shortness of breath, cough or sputum.  GASTROINTESTINAL: No anorexia, nausea, vomiting or diarrhea. No abdominal pain or blood.  GENITOURINARY: No burning on urination, no polyuria NEUROLOGICAL: occasional dizziness MUSCULOSKELETAL: No muscle, back pain, joint pain or stiffness.  LYMPHATICS: No enlarged nodes. No history of splenectomy.  PSYCHIATRIC: No history of depression or anxiety.  ENDOCRINOLOGIC: No reports of sweating, cold or heat intolerance. No polyuria or polydipsia.  Marland Kitchen   Physical Examination Gen: resting comfortably, no acute distress HEENT: no scleral icterus, pupils equal round and reactive, no palptable cervical adenopathy,  CV: RRR, no m/r/g, no JVD, no carotid bruits Resp: Clear to auscultation bilaterally GI: abdomen is soft, non-tender, non-distended, normal bowel sounds, no hepatosplenomegaly MSK: extremities are warm, no edema.  Skin: warm, no rash Neuro:  no focal deficits Psych: appropriate affect    Assessment and Plan   1. Bradycardia - unclear if could be playing a role in his symptoms. 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 There are no symptoms reported on the monitor to correlate - exercise stress shows normal  chronotropic competence - from prior notes speculation of whether he may have had Lyme disease in the past, though after ID eval thought not to be the case. If Lyme related conduction disease do not think that it would be transient, nor does he have evidence of any block -  Will continue to follow symptoms, he has been started on CPAP and OSA could explain much of these findings - may need repeat  monitor to further correlate heart rates with symptoms    Antoine Poche, M.D.

## 2014-04-11 NOTE — Patient Instructions (Signed)
Continue all current medications. Follow up in  1 month  

## 2014-05-08 ENCOUNTER — Encounter: Payer: Self-pay | Admitting: Cardiology

## 2014-05-21 ENCOUNTER — Ambulatory Visit: Payer: Managed Care, Other (non HMO) | Admitting: Cardiology

## 2014-05-21 NOTE — Progress Notes (Unsigned)
Clinical Summary Mr. Lucius ConnMedina is a 43 y.o.male seen today for follow up of the following medical problems.   1. Bradycardia  - reports history of intermittent fatigue and dizziness  - from patient reported history he has had low heart rates in 50s for long time  - recently started workup for possible symptomatic bradycardia  - recent monitor ordered by pcp, min HR 35 (630AM), max HR 150, avg 71.No significant ectopy. No diary submitted. The low rates in 30s and 40s were sinus brady, some occuring in early morning hours and some in early evening. 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  - ETT 02/2014, patient reached peak HR 177 bpm, representing 90% of THR. Exercised 10 minutes, no ST changes, low risk Duke treadmill score of 10.  - TSH 1.45. Some question about history of Lyme disease, recently seen by ID and thought not to be the case. He has had multiple serologies tested.    2. Sleep apnea  - recent positive sleep study, has just started CPAP  Past Medical History  Diagnosis Date  . Hyperlipidemia      No Known Allergies   Current Outpatient Prescriptions  Medication Sig Dispense Refill  . CRESTOR 5 MG tablet Take 1 tablet by mouth daily.      Marland Kitchen. ibuprofen (ADVIL) 200 MG tablet Take 200 mg by mouth every 6 (six) hours as needed.       No current facility-administered medications for this visit.     Past Surgical History  Procedure Laterality Date  . Back surgery       No Known Allergies    Family History  Problem Relation Age of Onset  . Diabetes Mother   . Heart disease Father   . Diabetes Father   . Hypertension Father   . Hyperlipidemia Father      Social History Mr. Lucius ConnMedina reports that he has never smoked. He has never used smokeless tobacco. Mr. Lucius ConnMedina reports that he does not drink alcohol.   Review of Systems CONSTITUTIONAL: No weight loss, fever, chills, weakness or fatigue.  HEENT: Eyes: No visual loss, blurred  vision, double vision or yellow sclerae.No hearing loss, sneezing, congestion, runny nose or sore throat.  SKIN: No rash or itching.  CARDIOVASCULAR:  RESPIRATORY: No shortness of breath, cough or sputum.  GASTROINTESTINAL: No anorexia, nausea, vomiting or diarrhea. No abdominal pain or blood.  GENITOURINARY: No burning on urination, no polyuria NEUROLOGICAL: No headache, dizziness, syncope, paralysis, ataxia, numbness or tingling in the extremities. No change in bowel or bladder control.  MUSCULOSKELETAL: No muscle, back pain, joint pain or stiffness.  LYMPHATICS: No enlarged nodes. No history of splenectomy.  PSYCHIATRIC: No history of depression or anxiety.  ENDOCRINOLOGIC: No reports of sweating, cold or heat intolerance. No polyuria or polydipsia.  Marland Kitchen.   Physical Examination There were no vitals filed for this visit. There were no vitals filed for this visit.  Gen: resting comfortably, no acute distress HEENT: no scleral icterus, pupils equal round and reactive, no palptable cervical adenopathy,  CV Resp: Clear to auscultation bilaterally GI: abdomen is soft, non-tender, non-distended, normal bowel sounds, no hepatosplenomegaly MSK: extremities are warm, no edema.  Skin: warm, no rash Neuro:  no focal deficits Psych: appropriate affect   Diagnostic Studies     Assessment and Plan  1. Bradycardia  - unclear if could be playing a role in his symptoms. Recent monitor shows episodes of sinus bradycardia to  30s to 40s at variable times of the day, he however states he often works third shift and his sleep cycle can be variable, it is unclear if these episodes may have occurred while sleeping from the available date. There are no symptoms reported on the monitor to correlate  - exercise stress shows normal chronotropic competence  - from prior notes speculation of whether he may have had Lyme disease in the past, though after ID eval thought not to be the case. If Lyme related  conduction disease do not think that it would be transient, nor does he have evidence of any block  - Will continue to follow symptoms, he has been started on CPAP and OSA could explain much of these findings  - may need repeat monitor to further correlate heart rates with symptoms       Antoine Poche, M.D

## 2015-02-25 ENCOUNTER — Ambulatory Visit (HOSPITAL_COMMUNITY)
Admission: RE | Admit: 2015-02-25 | Discharge: 2015-02-25 | Disposition: A | Discharge status: Home / Self Care | Payer: Managed Care, Other (non HMO) | Source: Ambulatory Visit | Attending: Internal Medicine | Admitting: Internal Medicine

## 2015-02-25 DIAGNOSIS — A770 Spotted fever due to Rickettsia rickettsii: Secondary | ICD-10-CM | POA: Diagnosis present

## 2015-02-25 MED ORDER — SODIUM CHLORIDE 0.9 % IJ SOLN: 10.0000 mL | Freq: Two times a day (BID) | INTRAMUSCULAR | Status: DC

## 2015-02-25 MED ORDER — SODIUM CHLORIDE 0.9 % IJ SOLN: 10.0000 mL | INTRAMUSCULAR | Status: DC | PRN

## 2015-02-25 NOTE — Discharge Instructions (Signed)
Peripherally Inserted Central Catheter/Midline Placement  The IV Nurse has discussed with the patient and/or persons authorized to consent for the patient, the purpose of this procedure and the potential benefits and risks involved with this procedure.  The benefits include less needle sticks, lab draws from the catheter and patient may be discharged home with the catheter.  Risks include, but not limited to, infection, bleeding, blood clot (thrombus formation), and puncture of an artery; nerve damage and irregular heat beat.  Alternatives to this procedure were also discussed.  PICC/Midline Placement Documentation  PICC / Midline Single Lumen 02/25/15 PICC Right Basilic 42 cm 2 cm (Active)  Indication for Insertion or Continuance of Line Home intravenous therapies (PICC only) 02/25/2015  1:40 PM  Exposed Catheter (cm) 42 cm 02/25/2015  1:40 PM  Site Assessment Clean;Dry;Intact 02/25/2015  1:40 PM  Line Status Flushed;Capped (central line);Blood return noted 02/25/2015  1:40 PM  Dressing Type Transparent;Securing device 02/25/2015  1:40 PM  Dressing Status Clean;Dry;Intact;Antimicrobial disc in place 02/25/2015  1:40 PM  Line Care Connections checked and tightened 02/25/2015  1:40 PM  Line Adjustment (NICU/IV Team Only) Yes 02/25/2015  1:40 PM  Dressing Intervention New dressing 02/25/2015  1:40 PM  Dressing Change Due 03/04/15 02/25/2015  1:40 PM       Trenton GammonWitherspoon, Kiwanna Spraker S 02/25/2015, 1:53 PM PICC Insertion, Care After Refer to this sheet in the next few weeks. These instructions provide you with information on caring for yourself after your procedure. Your health care provider may also give you more specific instructions. Your treatment has been planned according to current medical practices, but problems sometimes occur. Call your health care provider if you have any problems or questions after your procedure. WHAT TO EXPECT AFTER THE PROCEDURE After your procedure, it is typical to have the  following:  Mild discomfort at the insertion site. This should not last more than a day. HOME CARE INSTRUCTIONS  Rest at home for the remainder of the day after the procedure.  You may bend your arm and move it freely. If your PICC is near or at the bend of your elbow, avoid activity with repeated motion at the elbow.  Avoid lifting heavy objects as instructed by your health care provider.  Avoid using a crutch with the arm on the same side as your PICC. You may need to use a walker. Bandage Care  Keep your PICC bandage (dressing) clean and dry to prevent infection.  Ask your health care provider when you may shower. To keep the dressing dry, cover the PICC with plastic wrap and tape before showering. If the dressing does become wet, replace it right after the shower.  Do not soak in the bath, swim, or use hot tubs when you have a PICC.  Change the PICC dressing as instructed by your health care provider.  Change your PICC dressing if it becomes loose or wet. General PICC Care  Check the PICC insertion site daily for leakage, redness, swelling, or pain.  Flush the PICC as directed by your health care provider. Let your health care provider know right away if the PICC is difficult to flush or does not flush. Do not use force to flush the PICC.  Do not use a syringe that is less than 10 mL to flush the PICC.  Never pull or tug on the PICC.  Avoid blood pressure checks on the arm with the PICC.  Keep your PICC identification card with you at all times.  Do not  take the PICC out yourself. Only a trained health care professional should remove the PICC. SEEK MEDICAL CARE IF:  You have pain in your arm, ear, face, or teeth.  You have fever or chills.  You have drainage from the PICC insertion site.  You have redness or palpate a "cord" around the PICC insertion site.  You cannot flush the catheter. SEEK IMMEDIATE MEDICAL CARE IF:  You have swelling in the arm in which the  PICC is inserted. Document Released: 05/22/2013 Document Revised: 08/06/2013 Document Reviewed: 05/22/2013 [facility] Patient Information 2015 [place], [place]. This information is not intended to replace advice given to you by your health care provider. Make sure you discuss any questions you have with your health care provider. PICC Home Guide A peripherally inserted central catheter (PICC) is a long, thin, flexible tube that is inserted into a vein in the upper arm. It is a form of intravenous (IV) access. It is considered to be a "central" line because the tip of the PICC ends in a large vein in your chest. This large vein is called the superior vena cava (SVC). The PICC tip ends in the SVC because there is a lot of blood flow in the SVC. This allows medicines and IV fluids to be quickly distributed throughout the body. The PICC is inserted using a sterile technique by a specially trained nurse or physician. After the PICC is inserted, a chest X-ray exam is done to be sure it is in the correct place.  A PICC may be placed for different reasons, such as:  To give medicines and liquid nutrition that can only be given through a central line. Examples are:  Certain antibiotic treatments.  Chemotherapy.  Total parenteral nutrition (TPN).  To take frequent blood samples.  To give IV fluids and blood products.  If there is difficulty placing a peripheral intravenous (PIV) catheter. If taken care of properly, a PICC can remain in place for several months. A PICC can also allow a person to go home from the hospital early. Medicine and PICC care can be managed at home by a family member or home health care team. WHAT PROBLEMS CAN HAPPEN WHEN I HAVE A PICC? Problems with a PICC can occasionally occur. These may include the following:  A blood clot (thrombus) forming in or at the tip of the PICC. This can cause the PICC to become clogged. A clot-dissolving medicine called tissue plasminogen activator  (tPA) can be given through the PICC to help break up the clot.  Inflammation of the vein (phlebitis) in which the PICC is placed. Signs of inflammation may include redness, pain at the insertion site, red streaks, or being able to feel a "cord" in the vein where the PICC is located.  Infection in the PICC or at the insertion site. Signs of infection may include fever, chills, redness, swelling, or pus drainage from the PICC insertion site.  PICC movement (malposition). The PICC tip may move from its original position due to excessive physical activity, forceful coughing, sneezing, or vomiting.  A break or cut in the PICC. It is important to not use scissors near the PICC.  Nerve or tendon irritation or injury during PICC insertion. WHAT SHOULD I KEEP IN MIND ABOUT ACTIVITIES WHEN I HAVE A PICC?  You may bend your arm and move it freely. If your PICC is near or at the bend of your elbow, avoid activity with repeated motion at the elbow.  Rest at home for the  remainder of the day following PICC line insertion.  Avoid lifting heavy objects as instructed by your health care provider.  Avoid using a crutch with the arm on the same side as your PICC. You may need to use a walker. WHAT SHOULD I KNOW ABOUT MY PICC DRESSING?  Keep your PICC bandage (dressing) clean and dry to prevent infection.  Ask your health care provider when you may shower. Ask your health care provider to teach you how to wrap the PICC when you do take a shower.  Change the PICC dressing as instructed by your health care provider.  Change your PICC dressing if it becomes loose or wet. WHAT SHOULD I KNOW ABOUT PICC CARE?  Check the PICC insertion site daily for leakage, redness, swelling, or pain.  Do not take a bath, swim, or use hot tubs when you have a PICC. Cover PICC line with clear plastic wrap and tape to keep it dry while showering.  Flush the PICC as directed by your health care provider. Let your health care  provider know right away if the PICC is difficult to flush or does not flush. Do not use force to flush the PICC.  Do not use a syringe that is less than 10 mL to flush the PICC.  Never pull or tug on the PICC.  Avoid blood pressure checks on the arm with the PICC.  Keep your PICC identification card with you at all times.  Do not take the PICC out yourself. Only a trained clinical professional should remove the PICC. SEEK IMMEDIATE MEDICAL CARE IF:  Your PICC is accidentally pulled all the way out. If this happens, cover the insertion site with a bandage or gauze dressing. Do not throw the PICC away. Your health care provider will need to inspect it.  Your PICC was tugged or pulled and has partially come out. Do not  push the PICC back in.  There is any type of drainage, redness, or swelling where the PICC enters the skin.  You cannot flush the PICC, it is difficult to flush, or the PICC leaks around the insertion site when it is flushed.  You hear a "flushing" sound when the PICC is flushed.  You have pain, discomfort, or numbness in your arm, shoulder, or jaw on the same side as the PICC.  You feel your heart "racing" or skipping beats.  You notice a hole or tear in the PICC.  You develop chills or a fever. MAKE SURE YOU:   Understand these instructions.  Will watch your condition.  Will get help right away if you are not doing well or get worse. Document Released: 02/05/2003 Document Revised: 12/16/2013 Document Reviewed: 04/08/2013 [facility] Patient Information 2015 [place], [place]. This information is not intended to replace advice given to you by your health care provider. Make sure you discuss any questions you have with your health care provider.

## 2015-02-25 NOTE — Progress Notes (Addendum)
Peripherally Inserted Central Catheter/Midline Placement  The IV Nurse has discussed with the patient and/or persons authorized to consent for the patient, the purpose of this procedure and the potential benefits and risks involved with this procedure.  The benefits include less needle sticks, lab draws from the catheter and patient may be discharged home with the catheter.  Risks include, but not limited to, infection, bleeding, blood clot (thrombus formation), and puncture of an artery; nerve damage and irregular heat beat.  Alternatives to this procedure were also discussed.  PICC/Midline Placement Documentation  PICC / Midline Single Lumen 02/25/15 PICC Right Basilic 42 cm 2 cm (Active)  Indication for Insertion or Continuance of Line Home intravenous therapies (PICC only) 02/25/2015  1:40 PM  Exposed Catheter (cm) 42 cm 02/25/2015  1:40 PM  Site Assessment Clean;Dry;Intact 02/25/2015  1:40 PM  Line Status Flushed;Capped (central line);Blood return noted 02/25/2015  1:40 PM  Dressing Type Transparent;Securing device 02/25/2015  1:40 PM  Dressing Status Clean;Dry;Intact;Antimicrobial disc in place 02/25/2015  1:40 PM  Line Care Connections checked and tightened 02/25/2015  1:40 PM  Line Adjustment (NICU/IV Team Only) Yes 02/25/2015  1:40 PM  Dressing Intervention New dressing 02/25/2015  1:40 PM  Dressing Change Due 03/04/15 02/25/2015  1:40 PM    PICC confirmed by ECG technology.   Trenton GammonWitherspoon, Tracker Mance S 02/25/2015, 1:52 PM

## 2015-02-26 ENCOUNTER — Encounter (HOSPITAL_COMMUNITY)
Admission: RE | Admit: 2015-02-26 | Discharge: 2015-02-26 | Disposition: A | Discharge status: Home / Self Care | Payer: Managed Care, Other (non HMO) | Source: Ambulatory Visit | Attending: Internal Medicine | Admitting: Internal Medicine

## 2015-02-26 MED ORDER — SODIUM CHLORIDE 0.9 % IV SOLN: Freq: Once | INTRAVENOUS | Status: AC

## 2015-02-26 MED ORDER — DOXYCYCLINE HYCLATE 100 MG IV SOLR: 100.0000 mg | Freq: Two times a day (BID) | INTRAVENOUS | Status: AC

## 2015-02-27 ENCOUNTER — Encounter (HOSPITAL_COMMUNITY)
Admission: RE | Admit: 2015-02-27 | Discharge: 2015-02-27 | Disposition: A | Discharge status: Home / Self Care | Payer: Managed Care, Other (non HMO) | Source: Ambulatory Visit | Attending: Internal Medicine | Admitting: Internal Medicine

## 2015-02-27 ENCOUNTER — Encounter (HOSPITAL_COMMUNITY): Payer: Managed Care, Other (non HMO)

## 2015-02-28 ENCOUNTER — Encounter (HOSPITAL_COMMUNITY): Payer: Managed Care, Other (non HMO)

## 2015-03-01 ENCOUNTER — Encounter (HOSPITAL_COMMUNITY): Payer: Managed Care, Other (non HMO)

## 2015-03-02 ENCOUNTER — Encounter (HOSPITAL_COMMUNITY): Payer: Managed Care, Other (non HMO)

## 2015-03-03 ENCOUNTER — Encounter (HOSPITAL_COMMUNITY): Payer: Managed Care, Other (non HMO)

## 2015-03-04 ENCOUNTER — Encounter (HOSPITAL_COMMUNITY): Payer: Managed Care, Other (non HMO)

## 2015-03-05 ENCOUNTER — Encounter (HOSPITAL_COMMUNITY): Payer: Managed Care, Other (non HMO)

## 2015-03-06 ENCOUNTER — Encounter (HOSPITAL_COMMUNITY): Payer: Managed Care, Other (non HMO)

## 2015-03-07 ENCOUNTER — Encounter (HOSPITAL_COMMUNITY): Payer: Managed Care, Other (non HMO)

## 2015-03-08 ENCOUNTER — Encounter (HOSPITAL_COMMUNITY): Payer: Managed Care, Other (non HMO)

## 2015-03-09 ENCOUNTER — Encounter (HOSPITAL_COMMUNITY): Payer: Managed Care, Other (non HMO)

## 2015-03-10 ENCOUNTER — Encounter (HOSPITAL_COMMUNITY): Payer: Managed Care, Other (non HMO)

## 2015-03-11 ENCOUNTER — Encounter (HOSPITAL_COMMUNITY): Payer: Managed Care, Other (non HMO)

## 2015-03-12 ENCOUNTER — Encounter (HOSPITAL_COMMUNITY): Payer: Managed Care, Other (non HMO)

## 2015-03-13 ENCOUNTER — Encounter (HOSPITAL_COMMUNITY): Payer: Managed Care, Other (non HMO)

## 2015-03-14 ENCOUNTER — Encounter (HOSPITAL_COMMUNITY): Payer: Managed Care, Other (non HMO)

## 2015-03-15 ENCOUNTER — Encounter (HOSPITAL_COMMUNITY): Payer: Managed Care, Other (non HMO)

## 2015-03-16 ENCOUNTER — Encounter (HOSPITAL_COMMUNITY): Payer: Managed Care, Other (non HMO)

## 2015-03-17 ENCOUNTER — Encounter (HOSPITAL_COMMUNITY): Payer: Managed Care, Other (non HMO)

## 2015-03-18 ENCOUNTER — Encounter (HOSPITAL_COMMUNITY): Payer: Managed Care, Other (non HMO)

## 2015-03-19 ENCOUNTER — Encounter (HOSPITAL_COMMUNITY): Payer: Managed Care, Other (non HMO)

## 2015-11-04 ENCOUNTER — Encounter: Payer: Self-pay | Admitting: Cardiology

## 2015-11-04 ENCOUNTER — Ambulatory Visit (INDEPENDENT_AMBULATORY_CARE_PROVIDER_SITE_OTHER): Payer: Managed Care, Other (non HMO) | Admitting: Cardiology

## 2015-11-04 VITALS — BP 120/76 | HR 50 | Ht 66.0 in | Wt 199.0 lb

## 2015-11-04 DIAGNOSIS — R0602 Shortness of breath: Secondary | ICD-10-CM | POA: Diagnosis not present

## 2015-11-04 DIAGNOSIS — R001 Bradycardia, unspecified: Secondary | ICD-10-CM

## 2015-11-04 DIAGNOSIS — E785 Hyperlipidemia, unspecified: Secondary | ICD-10-CM | POA: Diagnosis not present

## 2015-11-04 MED ORDER — ROSUVASTATIN CALCIUM 5 MG PO TABS: 5.0000 mg | ORAL_TABLET | Freq: Every day | ORAL | Status: AC

## 2015-11-04 NOTE — Progress Notes (Signed)
Patient ID: SACHIN FERENCZ, male   DOB: [birth year], 45 y.o.   MRN: [id number]     Clinical Summary Mr. Emert is a 45 y.o.male seen today for follow up of the following medical problems.   1. Bradycardia - reports history of intermittent fatigue and dizziness - from patient reported long history of intermittent low heart rates in 40-50s for long time - recent monitor ordered by pcp, min HR 35 (630AM), max HR 150, avg 71.No significant ectopy. No diary submitted. The low rates in 30s and 40s were sinus brady, some occuring in early morning hours and some in early evening. 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 - ETT 02/2014, patient reached peak HR 177 bpm, representing 90% of THR. Exercised 10 minutes, no ST changes, low risk Duke treadmill score of 10.    - continues to have episodes of weakness that occur randomly. Feels lightheaded, dizzy. Can occur at rest or with exertion. Feels like heart rate decreases. Episodes of weakness occurs 1-2 times a week. Can have some bilateral LE edema, can be pitting. Can have some occasional DOE. Checks heart rates can be down to 40s during these episodes.     2. Sleep apnea - compliant with CPAP  3. Hyperlipidemia - he stopped crestor on his own due to muscle aches. Off medications no change in muscle pains.    Past Medical History  Diagnosis Date  . Hyperlipidemia      No Known Allergies   Current Outpatient Prescriptions  Medication Sig Dispense Refill  . CRESTOR 5 MG tablet Take 1 tablet by mouth daily.    Marland Kitchen ibuprofen (ADVIL) 200 MG tablet Take 200 mg by mouth every 6 (six) hours as needed.     No current facility-administered medications for this visit.   Facility-Administered Medications Ordered in Other Visits  Medication Dose Route Frequency Provider Last Rate Last Dose  . 0.9 %  sodium chloride infusion   Intravenous Once Kirstie Peri, MD      . doxycycline (VIBRAMYCIN) 100 mg in dextrose 5 % 250 mL  IVPB  100 mg Intravenous Q12H Kirstie Peri, MD         Past Surgical History  Procedure Laterality Date  . Back surgery       No Known Allergies    Family History  Problem Relation Age of Onset  . Diabetes Mother   . Heart disease Father   . Diabetes Father   . Hypertension Father   . Hyperlipidemia Father      Social History Mr. Selvey reports that he has never smoked. He has never used smokeless tobacco. Mr. Dunnigan reports that he does not drink alcohol.   Review of Systems CONSTITUTIONAL: occasional fatigue HEENT: Eyes: No visual loss, blurred vision, double vision or yellow sclerae.No hearing loss, sneezing, congestion, runny nose or sore throat.  SKIN: No rash or itching.  CARDIOVASCULAR: per HPI RESPIRATORY: No cough or sputum.  GASTROINTESTINAL: No anorexia, nausea, vomiting or diarrhea. No abdominal pain or blood.  GENITOURINARY: No burning on urination, no polyuria NEUROLOGICAL: No headache, dizziness, syncope, paralysis, ataxia, numbness or tingling in the extremities. No change in bowel or bladder control.  MUSCULOSKELETAL: No muscle, back pain, joint pain or stiffness.  LYMPHATICS: No enlarged nodes. No history of splenectomy.  PSYCHIATRIC: No history of depression or anxiety.  ENDOCRINOLOGIC: No reports of sweating, cold or heat intolerance. No polyuria or polydipsia.  Marland Kitchen   Physical Examination Filed Vitals:  11/04/15 1336  BP: 120/76  Pulse: 50   Filed Vitals:   11/04/15 1336  Height: 5\' 6"  (1.676 m)  Weight: 199 lb (90.266 kg)    Gen: resting comfortably, no acute distress HEENT: no scleral icterus, pupils equal round and reactive, no palptable cervical adenopathy,  CV: RRR, no m/r/g, no jvd Resp: Clear to auscultation bilaterally GI: abdomen is soft, non-tender, non-distended, normal bowel sounds, no hepatosplenomegaly MSK: extremities are warm, no edema.  Skin: warm, no rash Neuro:  no focal deficits Psych: appropriate  affect     Assessment and Plan  1. Bradycardia - unclear if could be playing a role in his symptoms. 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 There are no symptoms reported on the monitor  - exercise stress shows normal chronotropic competence - discussed repeat monitor however patient with severe allergic reaction to prior monitors, and is more interested in considering a possible loop recorder. We will refer to EP for evaluation.   2. OSA - continue CPAP  3. Hyperlipidemia - restart crestor 5mg  daily  4. DOE - reports DOE with occasional LE edema, will obtain echo      Antoine PocheJonathan F. Emitt Maglione, M.D.

## 2015-11-04 NOTE — Patient Instructions (Signed)
   Referral to Dr. Johney FrameAllred for low heart rate. Your physician has requested that you have an echocardiogram. Echocardiography is a painless test that uses sound waves to create images of your heart. It provides your doctor with information about the size and shape of your heart and how well your heart's chambers and valves are working. This procedure takes approximately one hour. There are no restrictions for this procedure. Office will contact with results via phone or letter.   Begin Crestor 5mg  daily - [facility]. Continue all other medications.   Follow up in  3 months

## 2015-11-05 ENCOUNTER — Ambulatory Visit (INDEPENDENT_AMBULATORY_CARE_PROVIDER_SITE_OTHER): Payer: Managed Care, Other (non HMO)

## 2015-11-05 ENCOUNTER — Other Ambulatory Visit: Payer: Self-pay

## 2015-11-05 ENCOUNTER — Encounter: Payer: Self-pay | Admitting: *Deleted

## 2015-11-05 DIAGNOSIS — R0602 Shortness of breath: Secondary | ICD-10-CM | POA: Diagnosis not present

## 2015-11-09 ENCOUNTER — Telehealth: Payer: Self-pay | Admitting: *Deleted

## 2015-11-09 NOTE — Telephone Encounter (Signed)
-----   Message from Antoine PocheJonathan F Branch, MD sent at 11/09/2015  9:50 AM EDT ----- Echo looks good, his heart function is overall normal   Dominga FerryJ Branch MD

## 2015-11-09 NOTE — Telephone Encounter (Signed)
Pt aware, routed to pcp 

## 2015-12-03 ENCOUNTER — Institutional Professional Consult (permissible substitution): Payer: Managed Care, Other (non HMO) | Admitting: Internal Medicine

## 2015-12-04 ENCOUNTER — Encounter: Payer: Self-pay | Admitting: Internal Medicine

## 2015-12-08 ENCOUNTER — Encounter: Payer: Self-pay | Admitting: Internal Medicine

## 2015-12-16 ENCOUNTER — Encounter: Payer: Self-pay | Admitting: Internal Medicine

## 2016-02-11 ENCOUNTER — Ambulatory Visit: Payer: Managed Care, Other (non HMO) | Admitting: Cardiology

## 2016-02-11 NOTE — Progress Notes (Unsigned)
Clinical Summary Mr. Phillip Dickerson is a 45 y.o.male  seen today for follow up of the following medical problems.   1. Bradycardia - reports history of intermittent fatigue and dizziness - from patient reported long history of intermittent low heart rates in 40-50s for long time - recent monitor ordered by pcp, min HR 35 (630AM), max HR 150, avg 71.No significant ectopy. No diary submitted. The low rates in 30s and 40s were sinus brady, some occuring in early morning hours and some in early evening. 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 - ETT 02/2014, patient reached peak HR 177 bpm, representing 90% of THR. Exercised 10 minutes, no ST changes, low risk Duke treadmill score of 10.    - continues to have episodes of weakness that occur randomly. Feels lightheaded, dizzy. Can occur at rest or with exertion. Feels like heart rate decreases. Episodes of weakness occurs 1-2 times a week. Can have some bilateral LE edema, can be pitting. Can have some occasional DOE. Checks heart rates can be down to 40s during these episodes.   - last visit we referred to Dr Johney FrameAllred for evaluation, including consideration for loop recorder given his severe allergy to previous event monitors.   2. Sleep apnea - compliant with CPAP  3. Hyperlipidemia - he stopped crestor on his own due to muscle aches. Off medications no change in muscle pains. Past Medical History  Diagnosis Date  . Hyperlipidemia      No Known Allergies   Current Outpatient Prescriptions  Medication Sig Dispense Refill  . ciprofloxacin (CIPRO) 500 MG tablet Take 1 tablet by mouth 2 (two) times daily.    Marland Kitchen. ibuprofen (ADVIL) 200 MG tablet Take 200 mg by mouth every 6 (six) hours as needed.    . rosuvastatin (CRESTOR) 5 MG tablet Take 1 tablet (5 mg total) by mouth daily. 30 tablet 6   No current facility-administered medications for this visit.   Facility-Administered Medications Ordered in Other Visits    Medication Dose Route Frequency Provider Last Rate Last Dose  . 0.9 %  sodium chloride infusion   Intravenous Once Kirstie PeriAshish Shah, MD      . doxycycline (VIBRAMYCIN) 100 mg in dextrose 5 % 250 mL IVPB  100 mg Intravenous Q12H Kirstie PeriAshish Shah, MD         Past Surgical History  Procedure Laterality Date  . Back surgery       No Known Allergies    Family History  Problem Relation Age of Onset  . Diabetes Mother   . Heart disease Father   . Diabetes Father   . Hypertension Father   . Hyperlipidemia Father   . Heart disease Paternal Grandfather      Social History Mr. Phillip Dickerson reports that he has never smoked. He has never used smokeless tobacco. Mr. Phillip Dickerson reports that he does not drink alcohol.   Review of Systems CONSTITUTIONAL: No weight loss, fever, chills, weakness or fatigue.  HEENT: Eyes: No visual loss, blurred vision, double vision or yellow sclerae.No hearing loss, sneezing, congestion, runny nose or sore throat.  SKIN: No rash or itching.  CARDIOVASCULAR:  RESPIRATORY: No shortness of breath, cough or sputum.  GASTROINTESTINAL: No anorexia, nausea, vomiting or diarrhea. No abdominal pain or blood.  GENITOURINARY: No burning on urination, no polyuria NEUROLOGICAL: No headache, dizziness, syncope, paralysis, ataxia, numbness or tingling in the extremities. No change in bowel or bladder control.  MUSCULOSKELETAL: No muscle, back pain,  joint pain or stiffness.  LYMPHATICS: No enlarged nodes. No history of splenectomy.  PSYCHIATRIC: No history of depression or anxiety.  ENDOCRINOLOGIC: No reports of sweating, cold or heat intolerance. No polyuria or polydipsia.  Marland Kitchen.   Physical Examination There were no vitals filed for this visit. There were no vitals filed for this visit.  Gen: resting comfortably, no acute distress HEENT: no scleral icterus, pupils equal round and reactive, no palptable cervical adenopathy,  CV Resp: Clear to auscultation bilaterally GI: abdomen is  soft, non-tender, non-distended, normal bowel sounds, no hepatosplenomegaly MSK: extremities are warm, no edema.  Skin: warm, no rash Neuro:  no focal deficits Psych: appropriate affect   Diagnostic Studies  10/2015 echo Study Conclusions  - Left ventricle: The cavity size was normal. Wall thickness was  normal. Systolic function was normal. The estimated ejection  fraction was in the range of 60% to 65%. Wall motion was normal;  there were no regional wall motion abnormalities. There was a  borderline abnormality in the ratio of early to atrial left  ventricular filling. - Tricuspid valve: There was mild regurgitation. - Pulmonary arteries: Systolic pressure was mildly increased.   Assessment and Plan   1. Bradycardia - unclear if could be playing a role in his symptoms. 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 There are no symptoms reported on the monitor  - exercise stress shows normal chronotropic competence - discussed repeat monitor however patient with severe allergic reaction to prior monitors, and is more interested in considering a possible loop recorder. We will refer to EP for evaluation.   2. OSA - continue CPAP  3. Hyperlipidemia - restart crestor 5mg  daily  4. DOE - reports DOE with occasional LE edema, will obtain echo     Phillip Dickerson, M.D., F.A.C.C.

## 2016-03-13 IMAGING — CR DG CHEST 2V
2 series · 2 of 2 positions shown · non-contrast
Comparison: None.

CLINICAL DATA: Shortness of breath, dizziness, and weakness. Some
Cough. Never smoker.

EXAM:
CHEST  2 VIEW

[w chest pa]
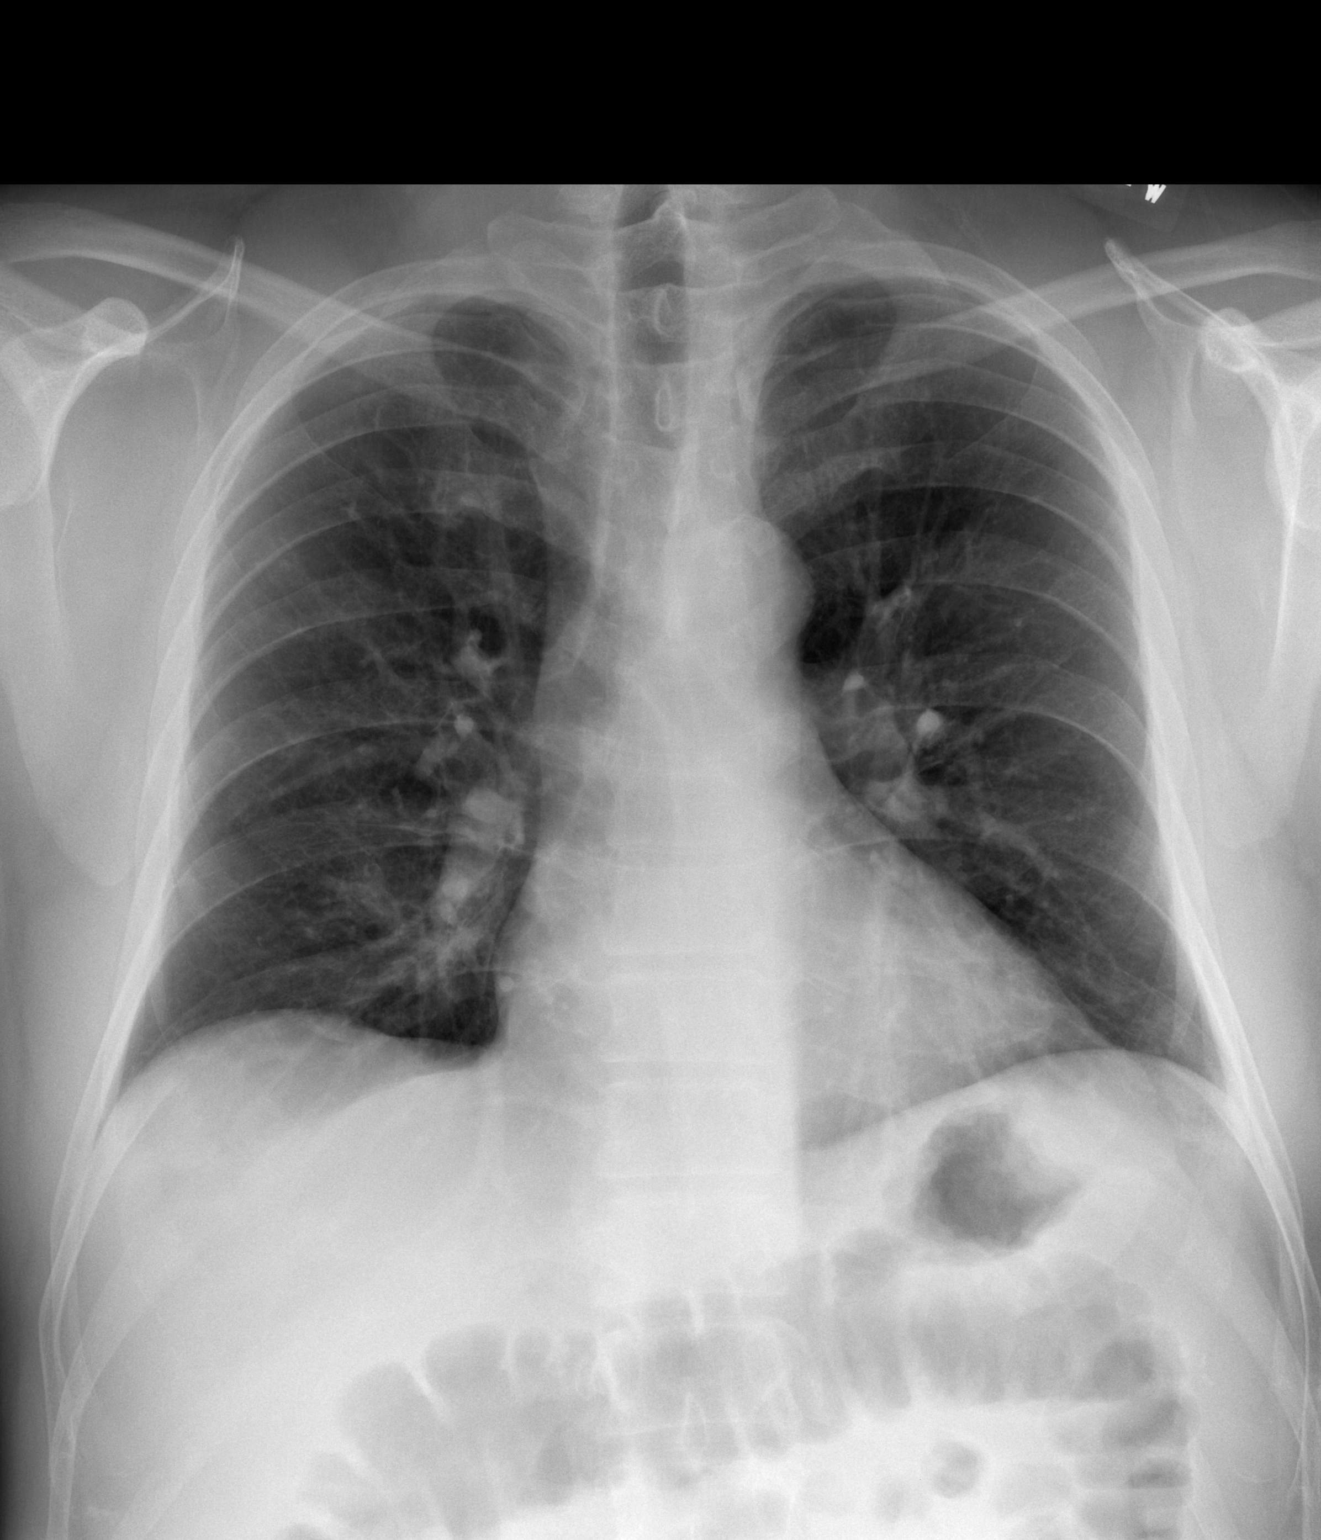

[w chest lat *]
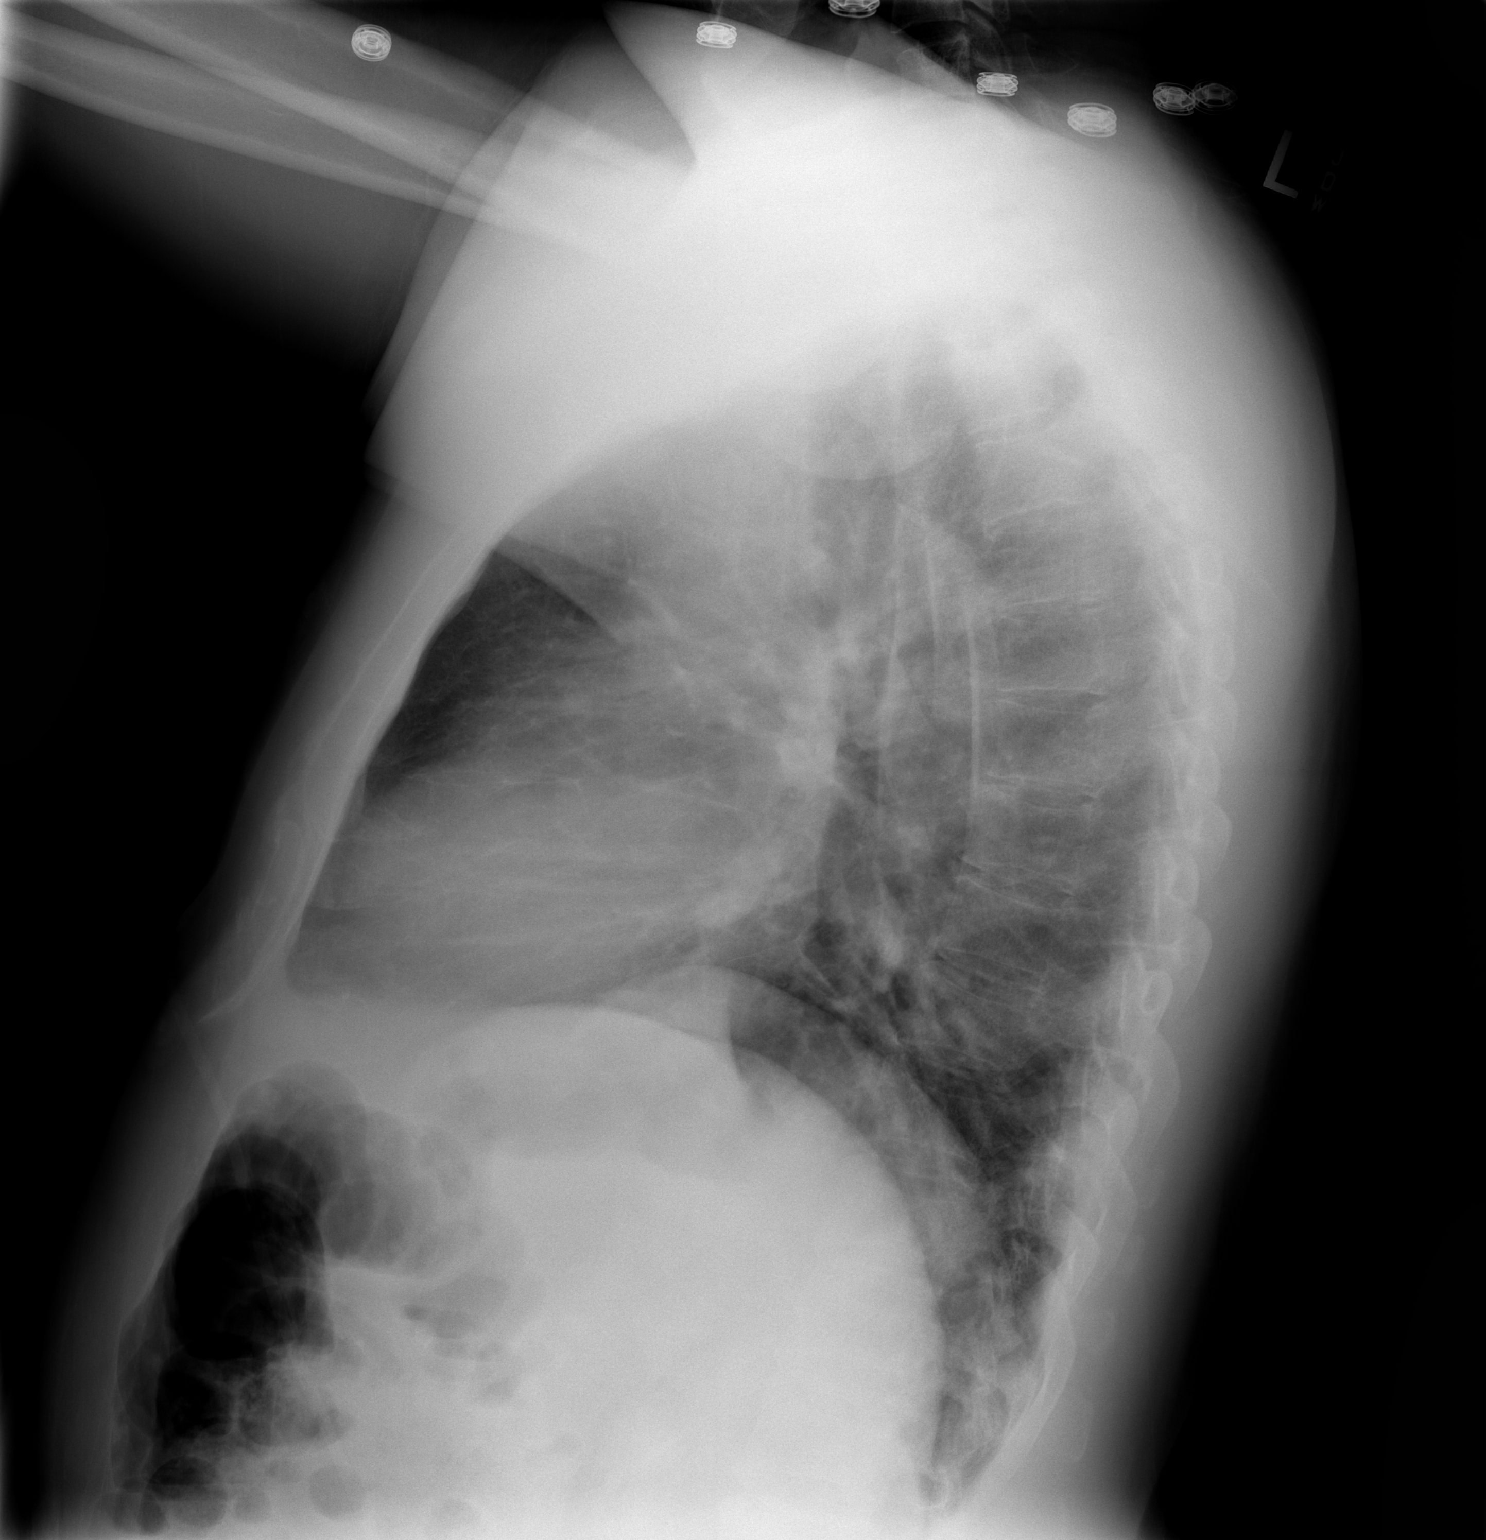

[2 of 2 positions shown; findings below may reference images not displayed]

FINDINGS: The heart size and mediastinal contours are within normal limits.
Both lungs are clear. The visualized skeletal structures are
unremarkable.
IMPRESSION: No active cardiopulmonary disease.

## 2016-06-03 ENCOUNTER — Encounter: Payer: Self-pay | Admitting: Internal Medicine

## 2016-06-06 NOTE — Progress Notes (Deleted)
*  IMAGE* Office Visit Note  Patient: Phillip Dickerson             Date of Birth: [birth year]           MRN: [id number]             PCP: Kirstie PeriSHAH,ASHISH, MD Referring: Kirstie PeriShah, Ashish, MD Visit Date: 06/07/2016    Subjective:  No chief complaint on file.   History of Present Illness: Phillip Greavessidro N Sakurai is a 45 y.o. male ***   Activities of Daily Living:  Patient reports morning stiffness for *** {minute/hour:19697}.   Patient {ACTIONS;DENIES/REPORTS:21021675::"Denies"} nocturnal pain.  Difficulty dressing/grooming: {ACTIONS;DENIES/REPORTS:21021675::"Denies"} Difficulty climbing stairs: {ACTIONS;DENIES/REPORTS:21021675::"Denies"} Difficulty getting out of chair: {ACTIONS;DENIES/REPORTS:21021675::"Denies"} Difficulty using hands for taps, buttons, cutlery, and/or writing: {ACTIONS;DENIES/REPORTS:21021675::"Denies"}   No Rheumatology ROS completed.   PMFS History:  Patient Active Problem List   Diagnosis Date Noted  . Bradycardia 02/13/2014  . Fatigue 02/13/2014  . Snoring 02/13/2014  . Dyslipidemia 02/11/2014  . Diffuse pain 02/11/2014  . Pedal edema 02/11/2014    Past Medical History:  Diagnosis Date  . Hyperlipidemia     Family History  Problem Relation Age of Onset  . Diabetes Mother   . Heart disease Father   . Diabetes Father   . Hypertension Father   . Hyperlipidemia Father   . Heart disease Paternal Grandfather    Past Surgical History:  Procedure Laterality Date  . BACK SURGERY     Social History   Social History Narrative  . No narrative on file     Objective: Vital Signs: There were no vitals taken for this visit.   Physical Exam   Musculoskeletal Exam: ***  CDAI Exam: No CDAI exam completed.    Investigation: No additional findings.   Imaging: No results found.  Speciality Comments: No specialty comments available.    Procedures:  No procedures performed Allergies: Review of patient's allergies indicates no known allergies.   Assessment  / Plan: Visit Diagnoses: Diffuse pain  Fatigue, unspecified type   No problem-specific Assessment & Plan notes found for this encounter.   Follow-Up Instructions: No Follow-up on file.  Orders: No orders of the defined types were placed in this encounter.  No orders of the defined types were placed in this encounter.

## 2016-06-06 NOTE — Progress Notes (Deleted)
*  IMAGE* Office Visit Note  Patient: Phillip Dickerson             Date of Birth: 29-Jun-1971           MRN: [id number]             PCP: Kirstie PeriSHAH,ASHISH, MD Referring: Kirstie PeriShah, Ashish, MD Visit Date: 06/07/2016    Subjective:  No chief complaint on file.   History of Present Illness: Phillip Dickerson is a 45 y.o. male originally from [place] , [place].In 2015 he was seen by Dr. Sherryll BurgerShah`***   Activities of Daily Living:  Patient reports morning stiffness for *** {minute/hour:19697}.   Patient {ACTIONS;DENIES/REPORTS:21021675::"Denies"} nocturnal pain.  Difficulty dressing/grooming: {ACTIONS;DENIES/REPORTS:21021675::"Denies"} Difficulty climbing stairs: {ACTIONS;DENIES/REPORTS:21021675::"Denies"} Difficulty getting out of chair: {ACTIONS;DENIES/REPORTS:21021675::"Denies"} Difficulty using hands for taps, buttons, cutlery, and/or writing: {ACTIONS;DENIES/REPORTS:21021675::"Denies"}   No Rheumatology ROS completed.   PMFS History:  Patient Active Problem List   Diagnosis Date Noted  . Bradycardia 02/13/2014  . Fatigue 02/13/2014  . Snoring 02/13/2014  . Dyslipidemia 02/11/2014  . Diffuse pain 02/11/2014  . Pedal edema 02/11/2014    Past Medical History:  Diagnosis Date  . Hyperlipidemia     Family History  Problem Relation Age of Onset  . Diabetes Mother   . Heart disease Father   . Diabetes Father   . Hypertension Father   . Hyperlipidemia Father   . Heart disease Paternal Grandfather    Past Surgical History:  Procedure Laterality Date  . BACK SURGERY     Social History   Social History Narrative  . No narrative on file     Objective: Vital Signs: There were no vitals taken for this visit.   Physical Exam   Musculoskeletal Exam: ***  CDAI Exam: No CDAI exam completed.    Investigation: No additional findings.   Imaging: No results found.  Speciality Comments: No specialty comments available.    Procedures:  No procedures performed Allergies: Review  of patient's allergies indicates no known allergies.   Assessment / Plan: Visit Diagnoses: No diagnosis found.   No problem-specific Assessment & Plan notes found for this encounter.   Follow-Up Instructions: No Follow-up on file.  Orders: No orders of the defined types were placed in this encounter.  No orders of the defined types were placed in this encounter.

## 2016-06-07 ENCOUNTER — Ambulatory Visit: Payer: Self-pay | Admitting: Rheumatology

## 2016-12-19 DIAGNOSIS — Z8669 Personal history of other diseases of the nervous system and sense organs: Secondary | ICD-10-CM | POA: Insufficient documentation

## 2016-12-19 DIAGNOSIS — Z8619 Personal history of other infectious and parasitic diseases: Secondary | ICD-10-CM | POA: Insufficient documentation

## 2016-12-19 DIAGNOSIS — R899 Unspecified abnormal finding in specimens from other organs, systems and tissues: Secondary | ICD-10-CM | POA: Insufficient documentation

## 2016-12-19 DIAGNOSIS — M5136 Other intervertebral disc degeneration, lumbar region: Secondary | ICD-10-CM | POA: Insufficient documentation

## 2016-12-19 DIAGNOSIS — Z8639 Personal history of other endocrine, nutritional and metabolic disease: Secondary | ICD-10-CM | POA: Insufficient documentation

## 2016-12-19 DIAGNOSIS — Z9889 Other specified postprocedural states: Secondary | ICD-10-CM | POA: Insufficient documentation

## 2016-12-19 DIAGNOSIS — M2550 Pain in unspecified joint: Secondary | ICD-10-CM | POA: Insufficient documentation

## 2016-12-19 NOTE — Progress Notes (Deleted)
   Office Visit Note  Patient: Phillip Greavessidro N Teeple             Date of Birth: 1971/05/14           MRN: [id number]             PCP: Kirstie PeriShah, Ashish, MD Referring: Kirstie PeriShah, Ashish, MD Visit Date: 12/22/2016 Occupation: @GUAROCC @    Subjective:  No chief complaint on file.   History of Present Illness: Phillip Dickerson is a 46 y.o. male ***   Activities of Daily Living:  Patient reports morning stiffness for *** {minute/hour:19697}.   Patient {ACTIONS;DENIES/REPORTS:21021675::"Denies"} nocturnal pain.  Difficulty dressing/grooming: {ACTIONS;DENIES/REPORTS:21021675::"Denies"} Difficulty climbing stairs: {ACTIONS;DENIES/REPORTS:21021675::"Denies"} Difficulty getting out of chair: {ACTIONS;DENIES/REPORTS:21021675::"Denies"} Difficulty using hands for taps, buttons, cutlery, and/or writing: {ACTIONS;DENIES/REPORTS:21021675::"Denies"}   No Rheumatology ROS completed.   PMFS History:  Patient Active Problem List   Diagnosis Date Noted  . History of Lyme disease 12/19/2016  . Multiple joint pain 12/19/2016  . History of sleep apnea 12/19/2016  . History of [place] spotted fever 12/19/2016  . History of lumbar surgery 2009 12/19/2016  . DDD (degenerative disc disease), lumbar 12/19/2016  . History of hyperlipidemia 12/19/2016  . Bradycardia 02/13/2014  . Fatigue 02/13/2014  . Snoring 02/13/2014  . Dyslipidemia 02/11/2014  . Diffuse pain 02/11/2014  . Pedal edema 02/11/2014    Past Medical History:  Diagnosis Date  . Hyperlipidemia     Family History  Problem Relation Age of Onset  . Diabetes Mother   . Heart disease Father   . Diabetes Father   . Hypertension Father   . Hyperlipidemia Father   . Heart disease Paternal Grandfather    Past Surgical History:  Procedure Laterality Date  . BACK SURGERY     Social History   Social History Narrative  . No narrative on file     Objective: Vital Signs: There were no vitals taken for this visit.   Physical Exam    Musculoskeletal Exam: ***  CDAI Exam: No CDAI exam completed.    Investigation: Findings:  01/08/2015:  lyme IgG/IgM Ab less than 0.91 Equivocal; RMSF IgG positive, RMSF IgM  Positive,RF<6 12/24/2015 RMSF IgM 2.71 positive, RMSF IgG 1:256 positive 10/27/15 normal CBC and CMP  06/10/15 RF less than 10 negative, Sed Rate 3, dS DNA less than 1 negative, CCP 7 negative, ANA negative,     Imaging: No results found.  Speciality Comments: No specialty comments available.    Procedures:  No procedures performed Allergies: Patient has no known allergies.   Assessment / Plan:     Visit Diagnoses: Other fatigue  Bradycardia  History of Lyme disease  Multiple joint pain  History of sleep apnea  History of Rocky [place] spotted fever  History of lumbar surgery 2009  DDD (degenerative disc disease), lumbar  History of hyperlipidemia  History of gastroesophageal reflux (GERD)    Orders: No orders of the defined types were placed in this encounter.  No orders of the defined types were placed in this encounter.   Face-to-face time spent with patient was *** minutes. 50% of time was spent in counseling and coordination of care.  Follow-Up Instructions: No Follow-up on file.   Pollyann SavoyShaili Lovett Coffin, MD  Note - This record has been created using [employer] software.  Chart creation errors have been sought, but may not always  have been located. Such creation errors do not reflect on  the standard of medical care.

## 2016-12-22 ENCOUNTER — Ambulatory Visit: Payer: Self-pay | Admitting: Rheumatology

## 2017-01-19 ENCOUNTER — Ambulatory Visit: Payer: Self-pay | Admitting: Rheumatology
